# Patient Record
Sex: Female | Born: 1955 | Race: White | Hispanic: No | Marital: Married | Smoking: Never smoker
Health system: Southern US, Community
[De-identification: ages and names within clinical notes are randomized; demographics above are authoritative.]

## PROBLEM LIST (undated history)

## (undated) DIAGNOSIS — G2581 Restless legs syndrome: Secondary | ICD-10-CM

## (undated) DIAGNOSIS — M722 Plantar fascial fibromatosis: Secondary | ICD-10-CM

## (undated) DIAGNOSIS — N8110 Cystocele, unspecified: Secondary | ICD-10-CM

## (undated) DIAGNOSIS — N393 Stress incontinence (female) (male): Secondary | ICD-10-CM

## (undated) DIAGNOSIS — B029 Zoster without complications: Secondary | ICD-10-CM

## (undated) DIAGNOSIS — M1990 Unspecified osteoarthritis, unspecified site: Secondary | ICD-10-CM

## (undated) DIAGNOSIS — N3281 Overactive bladder: Secondary | ICD-10-CM

## (undated) DIAGNOSIS — Z973 Presence of spectacles and contact lenses: Secondary | ICD-10-CM

## (undated) DIAGNOSIS — E7800 Pure hypercholesterolemia, unspecified: Secondary | ICD-10-CM

## (undated) HISTORY — PX: COLONOSCOPY: SHX174

---

## 2004-08-09 ENCOUNTER — Ambulatory Visit: Payer: Self-pay | Admitting: Family Medicine

## 2005-10-17 ENCOUNTER — Ambulatory Visit: Payer: Self-pay | Admitting: Internal Medicine

## 2007-08-21 ENCOUNTER — Ambulatory Visit: Payer: Self-pay

## 2008-09-01 ENCOUNTER — Ambulatory Visit: Payer: Self-pay

## 2009-08-30 ENCOUNTER — Ambulatory Visit: Payer: Self-pay | Admitting: Unknown Physician Specialty

## 2009-10-12 ENCOUNTER — Ambulatory Visit: Payer: Self-pay

## 2010-10-21 ENCOUNTER — Ambulatory Visit: Payer: Self-pay

## 2011-11-29 ENCOUNTER — Ambulatory Visit: Payer: Self-pay

## 2013-05-27 ENCOUNTER — Ambulatory Visit: Payer: Self-pay

## 2015-08-13 ENCOUNTER — Other Ambulatory Visit: Payer: Self-pay | Admitting: Obstetrics and Gynecology

## 2015-08-13 DIAGNOSIS — Z1231 Encounter for screening mammogram for malignant neoplasm of breast: Secondary | ICD-10-CM

## 2015-08-19 ENCOUNTER — Ambulatory Visit
Admission: RE | Admit: 2015-08-19 | Discharge: 2015-08-19 | Disposition: A | Discharge status: Home / Self Care | Payer: BC Managed Care – PPO | Source: Ambulatory Visit | Attending: Obstetrics and Gynecology | Admitting: Obstetrics and Gynecology

## 2015-08-19 DIAGNOSIS — Z1231 Encounter for screening mammogram for malignant neoplasm of breast: Secondary | ICD-10-CM | POA: Diagnosis present

## 2015-08-24 ENCOUNTER — Other Ambulatory Visit: Payer: Self-pay | Admitting: Obstetrics and Gynecology

## 2015-08-24 DIAGNOSIS — N6489 Other specified disorders of breast: Secondary | ICD-10-CM

## 2015-08-27 ENCOUNTER — Ambulatory Visit
Admission: RE | Admit: 2015-08-27 | Discharge: 2015-08-27 | Disposition: A | Discharge status: Home / Self Care | Payer: BC Managed Care – PPO | Source: Ambulatory Visit | Attending: Obstetrics and Gynecology | Admitting: Obstetrics and Gynecology

## 2015-08-27 DIAGNOSIS — N6489 Other specified disorders of breast: Secondary | ICD-10-CM | POA: Diagnosis present

## 2015-09-09 ENCOUNTER — Ambulatory Visit: Payer: BC Managed Care – PPO

## 2015-09-09 ENCOUNTER — Other Ambulatory Visit: Payer: BC Managed Care – PPO

## 2016-06-27 ENCOUNTER — Other Ambulatory Visit: Payer: Self-pay | Admitting: Obstetrics and Gynecology

## 2016-06-27 DIAGNOSIS — Z1231 Encounter for screening mammogram for malignant neoplasm of breast: Secondary | ICD-10-CM

## 2016-08-23 ENCOUNTER — Ambulatory Visit
Admission: RE | Admit: 2016-08-23 | Discharge: 2016-08-23 | Disposition: A | Discharge status: Home / Self Care | Payer: BC Managed Care – PPO | Source: Ambulatory Visit | Attending: Obstetrics and Gynecology | Admitting: Obstetrics and Gynecology

## 2016-08-23 DIAGNOSIS — Z1231 Encounter for screening mammogram for malignant neoplasm of breast: Secondary | ICD-10-CM | POA: Diagnosis not present

## 2016-08-30 ENCOUNTER — Other Ambulatory Visit: Payer: Self-pay | Admitting: Obstetrics and Gynecology

## 2016-08-30 DIAGNOSIS — N6320 Unspecified lump in the left breast, unspecified quadrant: Secondary | ICD-10-CM

## 2016-09-14 ENCOUNTER — Ambulatory Visit
Admission: RE | Admit: 2016-09-14 | Discharge: 2016-09-14 | Disposition: A | Discharge status: Home / Self Care | Payer: BC Managed Care – PPO | Source: Ambulatory Visit | Attending: Obstetrics and Gynecology | Admitting: Obstetrics and Gynecology

## 2016-09-14 DIAGNOSIS — N6320 Unspecified lump in the left breast, unspecified quadrant: Secondary | ICD-10-CM | POA: Diagnosis not present

## 2016-09-19 ENCOUNTER — Other Ambulatory Visit: Payer: Self-pay | Admitting: Obstetrics and Gynecology

## 2016-09-19 DIAGNOSIS — N6320 Unspecified lump in the left breast, unspecified quadrant: Secondary | ICD-10-CM

## 2016-09-28 ENCOUNTER — Ambulatory Visit
Admission: RE | Admit: 2016-09-28 | Discharge: 2016-09-28 | Disposition: A | Discharge status: Home / Self Care | Payer: BC Managed Care – PPO | Source: Ambulatory Visit | Attending: Obstetrics and Gynecology | Admitting: Obstetrics and Gynecology

## 2016-09-28 DIAGNOSIS — N6320 Unspecified lump in the left breast, unspecified quadrant: Secondary | ICD-10-CM

## 2016-09-28 DIAGNOSIS — N63 Unspecified lump in unspecified breast: Secondary | ICD-10-CM | POA: Insufficient documentation

## 2016-09-28 HISTORY — PX: BREAST BIOPSY: SHX20

## 2016-09-29 LAB — SURGICAL PATHOLOGY

## 2017-06-11 ENCOUNTER — Ambulatory Visit (INDEPENDENT_AMBULATORY_CARE_PROVIDER_SITE_OTHER): Payer: BC Managed Care – PPO

## 2017-06-11 ENCOUNTER — Encounter: Payer: Self-pay | Admitting: Podiatry

## 2017-06-11 ENCOUNTER — Other Ambulatory Visit: Payer: Self-pay | Admitting: Podiatry

## 2017-06-11 ENCOUNTER — Ambulatory Visit (INDEPENDENT_AMBULATORY_CARE_PROVIDER_SITE_OTHER): Payer: BC Managed Care – PPO | Admitting: Podiatry

## 2017-06-11 VITALS — BP 116/66 | HR 74 | Resp 16 | Ht 65.0 in | Wt 150.0 lb

## 2017-06-11 DIAGNOSIS — M79675 Pain in left toe(s): Secondary | ICD-10-CM

## 2017-06-11 DIAGNOSIS — D3610 Benign neoplasm of peripheral nerves and autonomic nervous system, unspecified: Secondary | ICD-10-CM | POA: Diagnosis not present

## 2017-06-11 DIAGNOSIS — G5762 Lesion of plantar nerve, left lower limb: Secondary | ICD-10-CM | POA: Diagnosis not present

## 2017-06-11 NOTE — Progress Notes (Signed)
   Subjective:    Patient ID: Michele Robinson, female    DOB: 12/31/1955, 61 y.o.   MRN: [id number]  HPI Chief Complaint  Patient presents with  . Toe Pain    Left foot; 2nd toe; pt stated, "Does cardio at the "[facility]"; wears orthotics to gym; about 30 minutes into routine, 2nd toe stars to ache and hurt and pain radiates to 3rd toe"; x3 months      Review of Systems  Musculoskeletal: Positive for gait problem.  All other systems reviewed and are negative.      Objective:   Physical Exam        Assessment & Plan:

## 2017-06-11 NOTE — Progress Notes (Signed)
Subjective:    Patient ID: Michele Robinson, female   DOB: 61 y.o.   MRN: [id number]   HPI patient states she gets a lot of pain between the second and third toes when she tries to be active and especially when doing the elliptical at the gym states it's been going on for a number of years    Review of Systems  All other systems reviewed and are negative.       Objective:  Physical Exam  Constitutional: She appears well-developed and well-nourished.  Cardiovascular: Intact distal pulses.   Musculoskeletal: Normal range of motion.  Neurological: She is alert.  Skin: Skin is warm.  Nursing note and vitals reviewed.  neurovascular status found to be intact muscle strength was adequate range of motion within normal limits with patient noted to have what appears to be a palpable mass second interspace left with radiating discomfort when palpated with minimal discomfort of the metatarsal phalangeal joint. Patient's found have good digital perfusion well oriented 3     Assessment:   Probability for neuroma symptomatology of the second interspace left foot with possibility for capsulitis or other inflammatory condition      Plan:    H&P and x-rays reviewed and today I did a careful injection of the second interspace with a accommodation neuro lysis purified alcohol Marcaine solution and a small amount steroid to reduce inflammation and we will reevaluate results and see how she responds to this over the next 2 weeks with consideration for further treatment. Reappoint to recheck  X-rays indicate that there is no indications of stress fracture or advanced arthritis

## 2017-06-25 ENCOUNTER — Ambulatory Visit (INDEPENDENT_AMBULATORY_CARE_PROVIDER_SITE_OTHER): Payer: BC Managed Care – PPO | Admitting: Podiatry

## 2017-06-25 ENCOUNTER — Encounter: Payer: Self-pay | Admitting: Podiatry

## 2017-06-25 DIAGNOSIS — D3610 Benign neoplasm of peripheral nerves and autonomic nervous system, unspecified: Secondary | ICD-10-CM | POA: Diagnosis not present

## 2017-06-25 DIAGNOSIS — M779 Enthesopathy, unspecified: Secondary | ICD-10-CM

## 2017-06-26 ENCOUNTER — Ambulatory Visit (INDEPENDENT_AMBULATORY_CARE_PROVIDER_SITE_OTHER): Payer: BC Managed Care – PPO | Admitting: Orthotics

## 2017-06-26 DIAGNOSIS — M779 Enthesopathy, unspecified: Secondary | ICD-10-CM | POA: Diagnosis not present

## 2017-06-26 DIAGNOSIS — D3610 Benign neoplasm of peripheral nerves and autonomic nervous system, unspecified: Secondary | ICD-10-CM

## 2017-06-26 NOTE — Progress Notes (Signed)
Subjective:    Patient ID: Michele Robinson, female   DOB: 61 y.o.   MRN: [id number]   HPI patient states that she is improving and she does not want anymore injections    ROS      Objective:  Physical Exam neurovascular status intact negative Homans sign was noted with patient's left foot showing decreased inflammation in the second interspace with mild discomfort still around the MPJs noted     Assessment:  Neuroma symptomatology which I believe has temporarily call down but is still present       Plan:  Reviewed further treatment and this point we are getting a replace rolled orthotics and put her in a new orthotic that will be softer and also try to change the position between the second and third metatarsals where there is what appears to be neuroma symptomatology and it's been recommended that a neuroma plug be placed on the third metatarsal. Patient is scheduled with pad orthotist

## 2017-06-26 NOTE — Progress Notes (Signed)
Patient came into today to be cast for [employer]. Upon recommendation of Dr. 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 Goals are rear foot stability, long arch support, neurma offload.  Plan vendor [employer] to fab semi-rigid device w/ top cover partialy glued as to be able to move neuroma pad around for optimal patient satisfaciton.

## 2017-07-10 ENCOUNTER — Other Ambulatory Visit: Payer: BC Managed Care – PPO | Admitting: Orthotics

## 2017-07-18 ENCOUNTER — Other Ambulatory Visit: Payer: Self-pay | Admitting: Obstetrics and Gynecology

## 2017-07-18 DIAGNOSIS — Z1231 Encounter for screening mammogram for malignant neoplasm of breast: Secondary | ICD-10-CM

## 2017-07-30 ENCOUNTER — Ambulatory Visit: Payer: BC Managed Care – PPO | Admitting: Orthotics

## 2017-07-30 DIAGNOSIS — M779 Enthesopathy, unspecified: Secondary | ICD-10-CM

## 2017-07-30 DIAGNOSIS — D3610 Benign neoplasm of peripheral nerves and autonomic nervous system, unspecified: Secondary | ICD-10-CM

## 2017-07-30 NOTE — Progress Notes (Signed)
Patient came into for her f/o to be adjusted to address Morton neuroma.  Pads were added but topcover not glued down due to wanting to make sure right place was hit for best possible outcome. patietn will f/up in two weeks

## 2017-08-14 ENCOUNTER — Other Ambulatory Visit: Payer: BC Managed Care – PPO | Admitting: Orthotics

## 2017-08-20 ENCOUNTER — Ambulatory Visit: Payer: BC Managed Care – PPO | Admitting: Orthotics

## 2017-08-20 DIAGNOSIS — M779 Enthesopathy, unspecified: Secondary | ICD-10-CM

## 2017-08-20 DIAGNOSIS — D3610 Benign neoplasm of peripheral nerves and autonomic nervous system, unspecified: Secondary | ICD-10-CM

## 2017-08-20 NOTE — Progress Notes (Signed)
Glued topcover onto her f/o.Marland Kitchen

## 2017-08-28 ENCOUNTER — Ambulatory Visit
Admission: RE | Admit: 2017-08-28 | Discharge: 2017-08-28 | Disposition: A | Discharge status: Home / Self Care | Payer: BC Managed Care – PPO | Source: Ambulatory Visit | Attending: Obstetrics and Gynecology | Admitting: Obstetrics and Gynecology

## 2017-08-28 DIAGNOSIS — Z1231 Encounter for screening mammogram for malignant neoplasm of breast: Secondary | ICD-10-CM | POA: Insufficient documentation

## 2018-01-17 ENCOUNTER — Other Ambulatory Visit: Payer: Self-pay | Admitting: Internal Medicine

## 2018-01-17 ENCOUNTER — Ambulatory Visit
Admission: RE | Admit: 2018-01-17 | Discharge: 2018-01-17 | Disposition: A | Discharge status: Home / Self Care | Payer: BC Managed Care – PPO | Source: Ambulatory Visit | Attending: Internal Medicine | Admitting: Internal Medicine

## 2018-01-17 DIAGNOSIS — K352 Acute appendicitis with generalized peritonitis, without abscess: Secondary | ICD-10-CM | POA: Diagnosis present

## 2018-01-17 DIAGNOSIS — R195 Other fecal abnormalities: Secondary | ICD-10-CM | POA: Diagnosis not present

## 2018-01-17 DIAGNOSIS — R1031 Right lower quadrant pain: Secondary | ICD-10-CM | POA: Diagnosis not present

## 2018-01-17 MED ORDER — IOPAMIDOL (ISOVUE-300) INJECTION 61%
100.0000 mL | Freq: Once | INTRAVENOUS | Status: AC | PRN
  Administered 2018-01-17: 100 mL via INTRAVENOUS

## 2018-01-21 ENCOUNTER — Other Ambulatory Visit: Payer: Self-pay | Admitting: Internal Medicine

## 2018-01-21 DIAGNOSIS — K352 Acute appendicitis with generalized peritonitis, without abscess: Secondary | ICD-10-CM

## 2018-08-01 ENCOUNTER — Other Ambulatory Visit: Payer: Self-pay | Admitting: Obstetrics and Gynecology

## 2018-08-01 DIAGNOSIS — Z1231 Encounter for screening mammogram for malignant neoplasm of breast: Secondary | ICD-10-CM

## 2018-08-29 ENCOUNTER — Ambulatory Visit
Admission: RE | Admit: 2018-08-29 | Discharge: 2018-08-29 | Disposition: A | Discharge status: Home / Self Care | Payer: BC Managed Care – PPO | Source: Ambulatory Visit | Attending: Obstetrics and Gynecology | Admitting: Obstetrics and Gynecology

## 2018-08-29 DIAGNOSIS — Z1231 Encounter for screening mammogram for malignant neoplasm of breast: Secondary | ICD-10-CM

## 2018-10-14 ENCOUNTER — Encounter

## 2018-10-14 ENCOUNTER — Ambulatory Visit: Payer: Self-pay | Admitting: Family Medicine

## 2019-08-07 ENCOUNTER — Other Ambulatory Visit: Payer: Self-pay | Admitting: Obstetrics and Gynecology

## 2019-08-07 DIAGNOSIS — Z1231 Encounter for screening mammogram for malignant neoplasm of breast: Secondary | ICD-10-CM

## 2019-09-01 ENCOUNTER — Ambulatory Visit
Admission: RE | Admit: 2019-09-01 | Discharge: 2019-09-01 | Disposition: A | Discharge status: Home / Self Care | Payer: BC Managed Care – PPO | Source: Ambulatory Visit | Attending: Obstetrics and Gynecology | Admitting: Obstetrics and Gynecology

## 2019-09-01 ENCOUNTER — Other Ambulatory Visit: Payer: Self-pay

## 2019-09-01 ENCOUNTER — Encounter (INDEPENDENT_AMBULATORY_CARE_PROVIDER_SITE_OTHER): Payer: Self-pay

## 2019-09-01 DIAGNOSIS — Z1231 Encounter for screening mammogram for malignant neoplasm of breast: Secondary | ICD-10-CM | POA: Insufficient documentation

## 2019-10-31 HISTORY — PX: CYSTOURETHROSCOPY: SHX476

## 2019-10-31 HISTORY — PX: OTHER SURGICAL HISTORY: SHX169

## 2020-08-03 ENCOUNTER — Other Ambulatory Visit: Payer: Self-pay | Admitting: Internal Medicine

## 2020-08-03 DIAGNOSIS — Z1231 Encounter for screening mammogram for malignant neoplasm of breast: Secondary | ICD-10-CM

## 2020-09-07 ENCOUNTER — Ambulatory Visit: Payer: BC Managed Care – PPO

## 2020-09-08 ENCOUNTER — Other Ambulatory Visit: Payer: Self-pay

## 2020-09-08 ENCOUNTER — Ambulatory Visit
Admission: RE | Admit: 2020-09-08 | Discharge: 2020-09-08 | Disposition: A | Discharge status: Home / Self Care | Payer: BC Managed Care – PPO | Source: Ambulatory Visit | Attending: Internal Medicine | Admitting: Internal Medicine

## 2020-09-08 DIAGNOSIS — Z1231 Encounter for screening mammogram for malignant neoplasm of breast: Secondary | ICD-10-CM | POA: Insufficient documentation

## 2020-09-10 ENCOUNTER — Other Ambulatory Visit: Payer: Self-pay | Admitting: Obstetrics and Gynecology

## 2020-09-14 ENCOUNTER — Other Ambulatory Visit: Payer: Self-pay | Admitting: Obstetrics and Gynecology

## 2020-09-14 DIAGNOSIS — N63 Unspecified lump in unspecified breast: Secondary | ICD-10-CM

## 2020-09-27 ENCOUNTER — Other Ambulatory Visit: Payer: BC Managed Care – PPO

## 2020-10-04 ENCOUNTER — Ambulatory Visit
Admission: RE | Admit: 2020-10-04 | Discharge: 2020-10-04 | Disposition: A | Discharge status: Home / Self Care | Payer: BC Managed Care – PPO | Source: Ambulatory Visit | Attending: Obstetrics and Gynecology | Admitting: Obstetrics and Gynecology

## 2020-10-04 ENCOUNTER — Other Ambulatory Visit: Payer: Self-pay

## 2020-10-04 DIAGNOSIS — N63 Unspecified lump in unspecified breast: Secondary | ICD-10-CM

## 2021-09-05 ENCOUNTER — Other Ambulatory Visit: Payer: Self-pay | Admitting: Obstetrics and Gynecology

## 2021-09-05 ENCOUNTER — Other Ambulatory Visit: Payer: Self-pay | Admitting: Internal Medicine

## 2021-09-05 DIAGNOSIS — Z1231 Encounter for screening mammogram for malignant neoplasm of breast: Secondary | ICD-10-CM

## 2021-10-05 ENCOUNTER — Other Ambulatory Visit: Payer: Self-pay

## 2021-10-05 ENCOUNTER — Ambulatory Visit
Admission: RE | Admit: 2021-10-05 | Discharge: 2021-10-05 | Disposition: A | Discharge status: Home / Self Care | Payer: BC Managed Care – PPO | Source: Ambulatory Visit | Attending: Internal Medicine | Admitting: Internal Medicine

## 2021-10-05 DIAGNOSIS — Z1231 Encounter for screening mammogram for malignant neoplasm of breast: Secondary | ICD-10-CM | POA: Insufficient documentation

## 2022-05-10 IMAGING — MG DIGITAL DIAGNOSTIC BILAT W/ TOMO W/ CAD
8 series · 8 of 24 positions shown · non-contrast
Comparison: Previous exam(s).

CLINICAL DATA: 64-year-old female presenting for annual exam as
well as for a possible mass in the upper inner left breast felt by
the patient's physician.

EXAM:
DIGITAL DIAGNOSTIC BILATERAL MAMMOGRAM WITH CAD AND TOMO
ULTRASOUND LEFT BREAST

[R CC synth-2D]
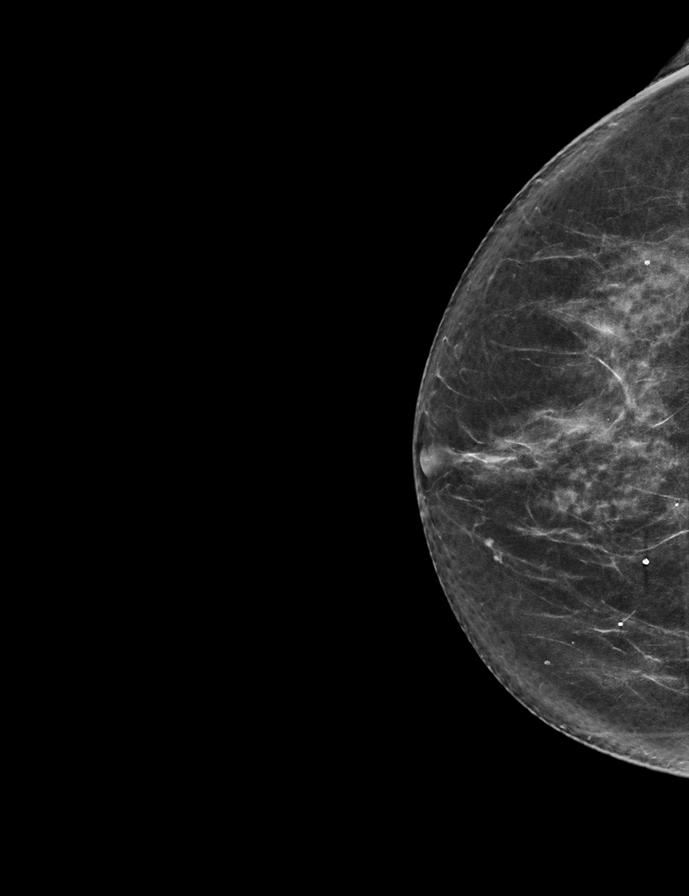

[L MLO synth-2D]
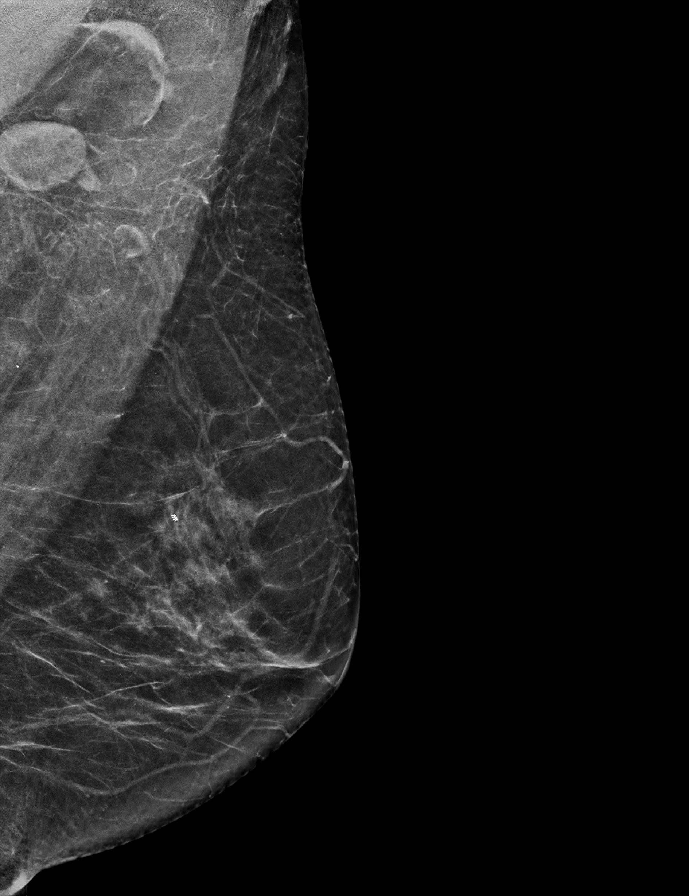

[R MLO synth-2D]
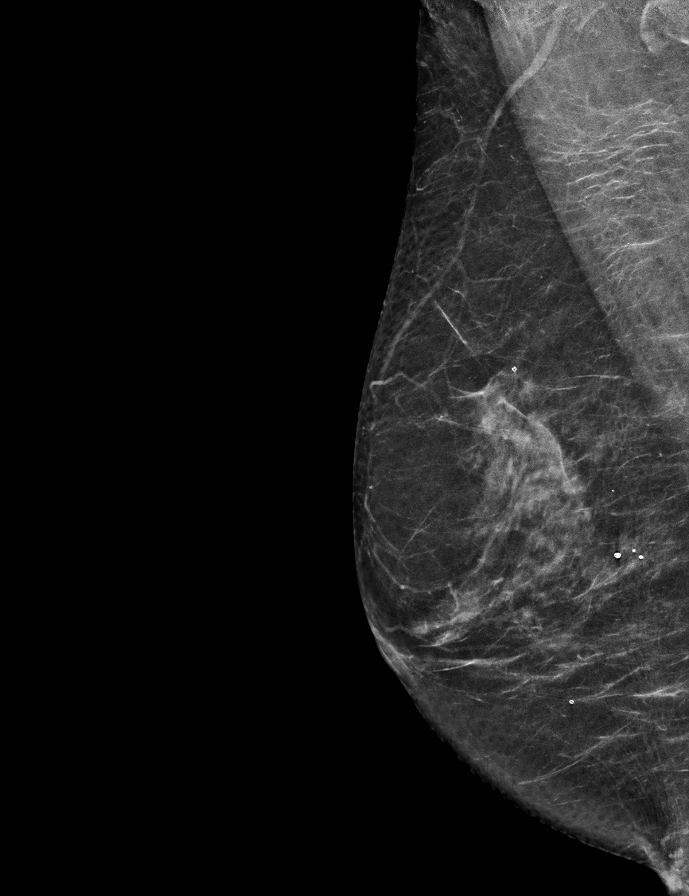

[L CC synth-2D]
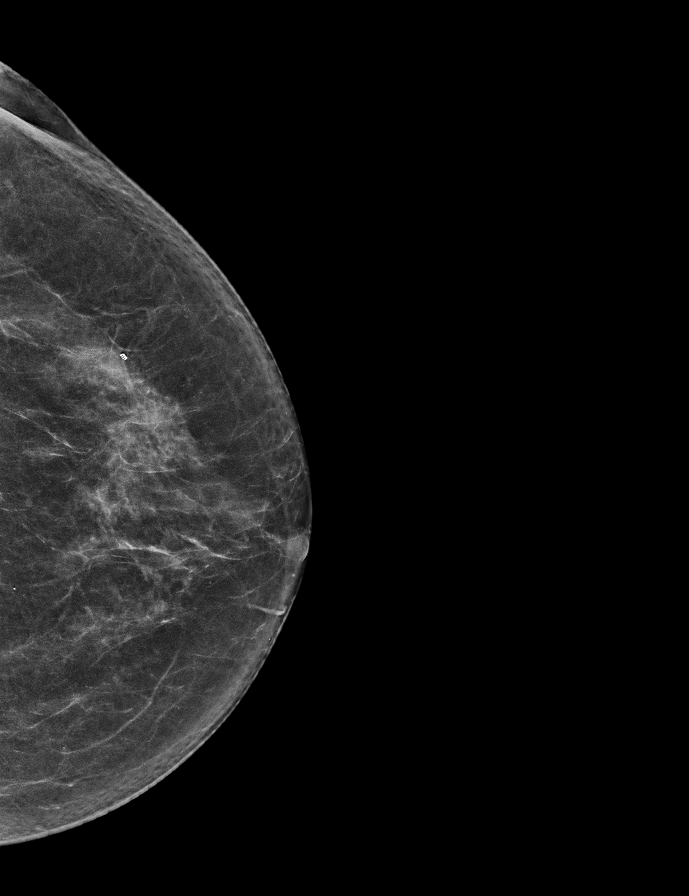

[L MLO tomo · tomo slice 33/65.0]
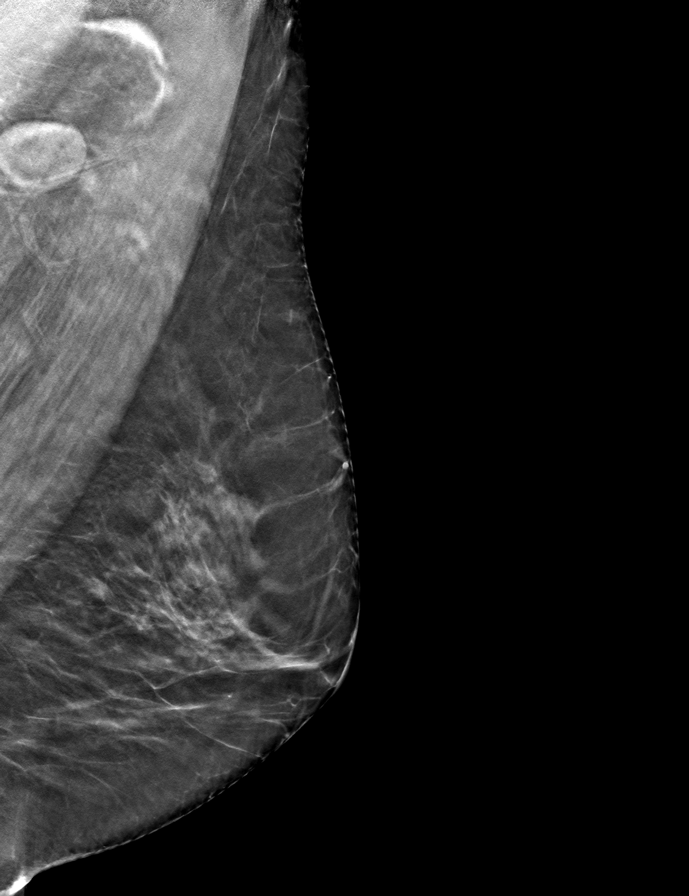

[R MLO tomo · tomo slice 31/62.0]
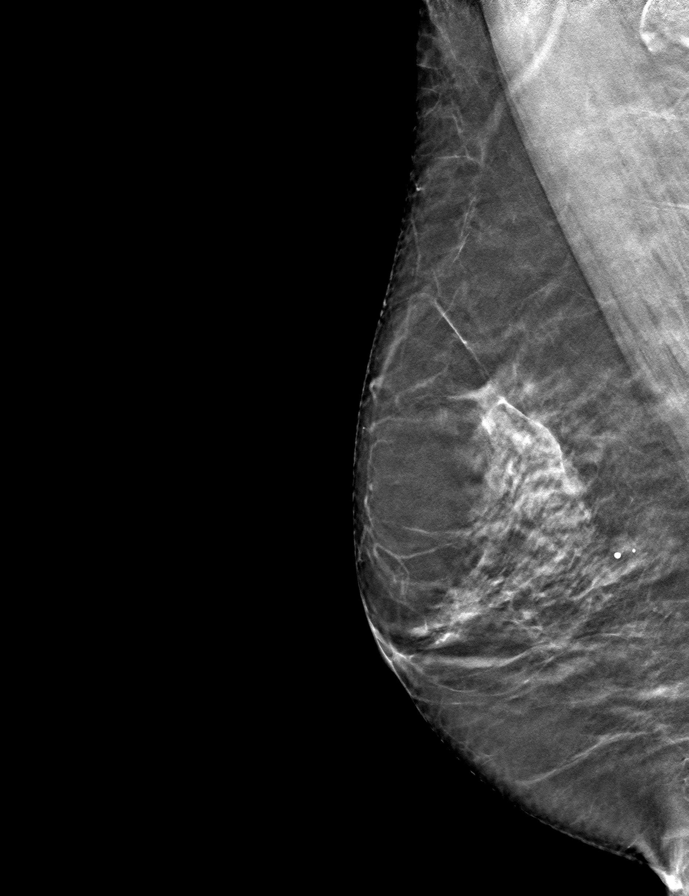

[L CC tomo · tomo slice 33/64.0]
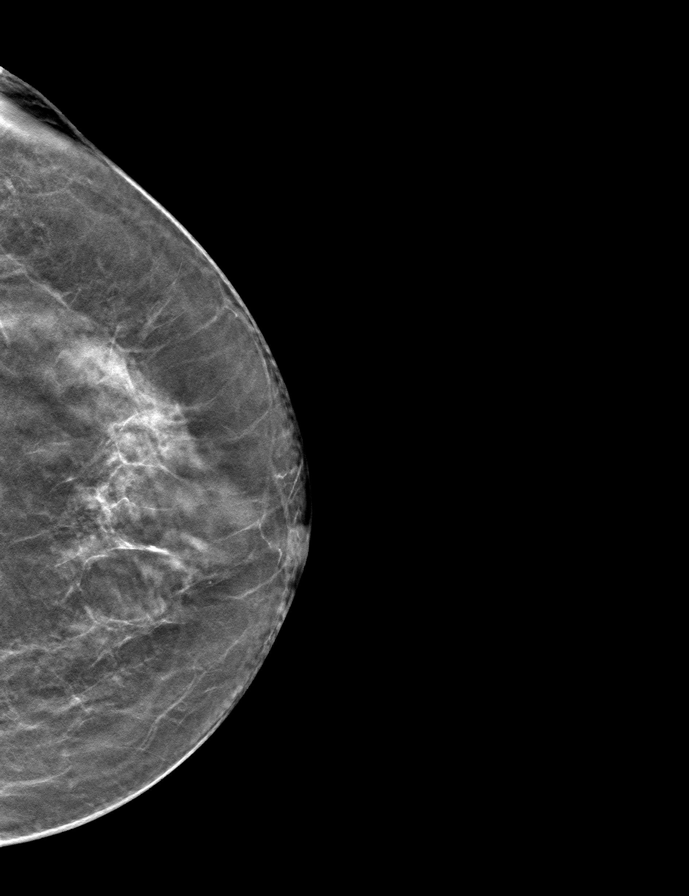

[R CC tomo · tomo slice 33/65.0]
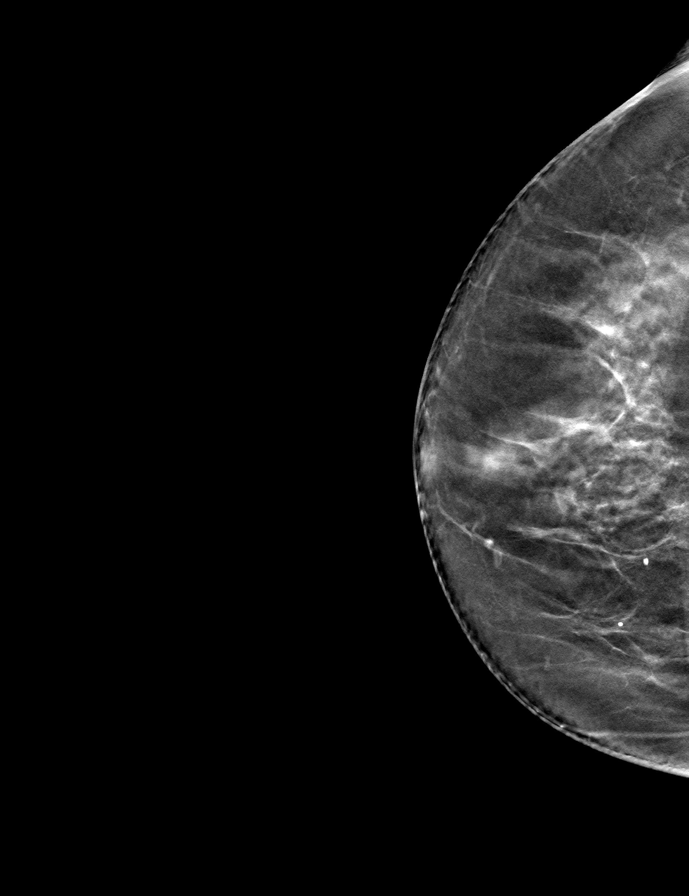

[8 of 24 positions shown; findings below may reference images not displayed]

ACR Breast Density Category c: The breast tissue is heterogeneously
dense, which may obscure small masses.
FINDINGS: Mammogram:

Right breast: No suspicious mass, distortion, or microcalcifications
are identified to suggest presence of malignancy.

Left breast: No suspicious mass, distortion, or microcalcifications
are identified to suggest presence of malignancy. Specifically there
is no new abnormality in the upper inner left breast.

There are a few mildly enlarged lymph nodes in the left axilla.

Mammographic images were processed with CAD.

Ultrasound:

Targeted ultrasound is performed in the left breast at 11 o'clock
demonstrating no cystic or solid mass.

Targeted ultrasound is performed in the left axilla demonstrating
lymph nodes with normal morphology and cortical thickness. Maximum
cortical thickness measures 0.3 cm. No suspicious lymph node
identified.
IMPRESSION: 1. No mammographic or sonographic evidence of malignancy or other
abnormality at the palpable site of concern in the upper inner
quadrant of the left breast.

2. Benign mildly enlarged left axillary lymph nodes, most likely
reactive secondary to patient's recent COVID booster vaccine.

3.  No mammographic evidence of malignancy in the right breast.

RECOMMENDATION:
1. Recommend any further workup of the palpable area of concern in
the left breast be on a clinical basis.

2.  Screening mammogram in one year.(Code:[id number]-[id number])

I have discussed the findings and recommendations with the patient.
If applicable, a reminder letter will be sent to the patient
regarding the next appointment.

BI-RADS CATEGORY  1: Negative.

## 2022-06-28 ENCOUNTER — Ambulatory Visit: Payer: BC Managed Care – PPO | Admitting: Podiatry

## 2022-06-28 ENCOUNTER — Encounter: Payer: Self-pay | Admitting: Podiatry

## 2022-06-28 ENCOUNTER — Ambulatory Visit (INDEPENDENT_AMBULATORY_CARE_PROVIDER_SITE_OTHER): Payer: Medicare HMO

## 2022-06-28 DIAGNOSIS — G5762 Lesion of plantar nerve, left lower limb: Secondary | ICD-10-CM | POA: Diagnosis not present

## 2022-06-28 DIAGNOSIS — D3610 Benign neoplasm of peripheral nerves and autonomic nervous system, unspecified: Secondary | ICD-10-CM

## 2022-06-28 DIAGNOSIS — M21619 Bunion of unspecified foot: Secondary | ICD-10-CM | POA: Diagnosis not present

## 2022-06-28 DIAGNOSIS — M21611 Bunion of right foot: Secondary | ICD-10-CM | POA: Diagnosis not present

## 2022-06-28 DIAGNOSIS — M2042 Other hammer toe(s) (acquired), left foot: Secondary | ICD-10-CM | POA: Diagnosis not present

## 2022-06-28 DIAGNOSIS — M2041 Other hammer toe(s) (acquired), right foot: Secondary | ICD-10-CM | POA: Diagnosis not present

## 2022-06-28 DIAGNOSIS — M21612 Bunion of left foot: Secondary | ICD-10-CM

## 2022-06-28 NOTE — Progress Notes (Signed)
Subjective:   Patient ID: Michele Robinson, female   DOB: 66 y.o.   MRN: [id number]   HPI Patient presents with a lot of pain in the left forefoot over the last few months with long-term history of neuroma symptoms and is concerned about the increase in size of the bunion deformity left which is becoming increasingly tender.  Patient has not been seen in a number years does not smoke likes to be active.  States that she is tried wider shoes she has tried other modalities for the bunion without relief of symptoms   Review of Systems  All other systems reviewed and are negative.       Objective:  Physical Exam Vitals and nursing note reviewed.  Constitutional:      Appearance: She is well-developed.  Pulmonary:     Effort: Pulmonary effort is normal.  Musculoskeletal:        General: Normal range of motion.  Skin:    General: Skin is warm.  Neurological:     Mental Status: She is alert.     Neurovascular status intact muscle strength adequate range of motion within normal limits with prominent first metatarsal head inflammation redness left over right and also pain with a shooting discomfort positive nodular formation third interspace left foot consistent with Biagio Borg.  Good digital perfusion well-oriented     Assessment:  Significant structural HAV deformity left with significant elevation intermetatarsal angle along with probability for neuroma symptomatology third interspace left     Plan:  H&P reviewed condition and x-rays of both feet and at this point did sterile prep and injected the third interspace with a combination of Xylocaine Marcaine steroid and dehydrated alcohol.  Did discuss surgical intervention for bunion and neuroma and patient wants to get this done and we will discuss in 2 weeks and I first want to see the response to conservative treatment for the left foot.  Discussed distal osteotomy what would be required and recovery  X-rays indicate significant  elevation of the intermetatarsal angle left over right with no indications of arthritis or other pathology

## 2022-07-14 ENCOUNTER — Ambulatory Visit (INDEPENDENT_AMBULATORY_CARE_PROVIDER_SITE_OTHER): Payer: Medicare HMO | Admitting: Podiatry

## 2022-07-14 ENCOUNTER — Encounter: Payer: Self-pay | Admitting: Podiatry

## 2022-07-14 DIAGNOSIS — D3610 Benign neoplasm of peripheral nerves and autonomic nervous system, unspecified: Secondary | ICD-10-CM | POA: Diagnosis not present

## 2022-07-14 DIAGNOSIS — M21619 Bunion of unspecified foot: Secondary | ICD-10-CM

## 2022-07-14 DIAGNOSIS — M21612 Bunion of left foot: Secondary | ICD-10-CM | POA: Diagnosis not present

## 2022-07-16 NOTE — Progress Notes (Signed)
Subjective:   Patient ID: Michele Robinson, female   DOB: 66 y.o.   MRN: [id number]   HPI Patient presents stating that still having pain off and on with the left foot and would like to have this foot fixed stating that she would like to have bunion correction and neuroma removed.  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  Explained procedure risk and patient wants surgery understanding risk and at this point I allowed her to read consent form going over pretreatment alternative treatments and risk.  She is willing to accept risk understands total recovery period can take approximate 6 months and at this point signed consent form scheduled for outpatient surgery.  Dispensed air fracture walker which was fitted properly to her lower leg with all instructions on usage and I would like her to get used to it prior to the procedure so she is comfortable with it at the time of surgery

## 2022-07-25 ENCOUNTER — Telehealth: Payer: Self-pay

## 2022-07-25 NOTE — Telephone Encounter (Signed)
Michele Robinson called to cancel her surgery with Dr. Paulla Dolly on 08/29/2022. She stated she just can't do it right now and will follow up with [place] after the first of the year. Notified Dr. Paulla Dolly and Caren Griffins with [facility]

## 2022-08-15 ENCOUNTER — Other Ambulatory Visit: Payer: Self-pay | Admitting: Podiatry

## 2022-08-21 ENCOUNTER — Other Ambulatory Visit: Payer: Self-pay | Admitting: Internal Medicine

## 2022-08-21 ENCOUNTER — Other Ambulatory Visit: Payer: Self-pay | Admitting: Obstetrics and Gynecology

## 2022-08-21 DIAGNOSIS — Z1231 Encounter for screening mammogram for malignant neoplasm of breast: Secondary | ICD-10-CM

## 2022-08-24 ENCOUNTER — Encounter: Payer: Self-pay | Admitting: Podiatry

## 2022-08-25 NOTE — Discharge Instructions (Signed)
[facility]  POST OPERATIVE INSTRUCTIONS FOR DR. FOWLER AND [facility]   Take your medication as prescribed.  Pain medication should be taken only as needed.  Keep the dressing clean, dry and intact.  Keep your foot elevated above the heart level for the first 48 hours.  Walking to the bathroom and brief periods of walking are acceptable, unless we have instructed you to be non-weight bearing.  Always wear your post-op shoe when walking.  Always use your crutches if you are to be non-weight bearing.  Do not take a shower. Baths are permissible as long as the foot is kept out of the water.   Every hour you are awake:  Bend your knee 15 times. Flex foot 15 times Massage calf 15 times  Call [facility] ([phone]) if any of the following problems occur: You develop a temperature or fever. The bandage becomes saturated with blood. Medication does not stop your pain. Injury of the foot occurs. Any symptoms of infection including redness, odor, or red streaks running from wound. 

## 2022-08-30 ENCOUNTER — Encounter
Admission: RE | Disposition: A | Discharge status: Home / Self Care | Payer: Self-pay | Source: Ambulatory Visit | Attending: Podiatry

## 2022-08-30 ENCOUNTER — Ambulatory Visit: Payer: Self-pay

## 2022-08-30 ENCOUNTER — Ambulatory Visit: Payer: Medicare HMO | Admitting: Anesthesiology

## 2022-08-30 ENCOUNTER — Other Ambulatory Visit: Payer: Self-pay

## 2022-08-30 ENCOUNTER — Ambulatory Visit
Admission: RE | Admit: 2022-08-30 | Discharge: 2022-08-30 | Disposition: A | Discharge status: Home / Self Care | Payer: Medicare HMO | Source: Ambulatory Visit | Attending: Podiatry | Admitting: Podiatry

## 2022-08-30 ENCOUNTER — Encounter: Payer: Self-pay | Admitting: Podiatry

## 2022-08-30 DIAGNOSIS — M2012 Hallux valgus (acquired), left foot: Secondary | ICD-10-CM | POA: Insufficient documentation

## 2022-08-30 DIAGNOSIS — G5762 Lesion of plantar nerve, left lower limb: Secondary | ICD-10-CM | POA: Diagnosis not present

## 2022-08-30 HISTORY — DX: Overactive bladder: N32.81

## 2022-08-30 HISTORY — DX: Plantar fascial fibromatosis: M72.2

## 2022-08-30 HISTORY — PX: BUNIONECTOMY: SHX129

## 2022-08-30 HISTORY — DX: Restless legs syndrome: G25.81

## 2022-08-30 HISTORY — DX: Unspecified osteoarthritis, unspecified site: M19.90

## 2022-08-30 HISTORY — DX: Pure hypercholesterolemia, unspecified: E78.00

## 2022-08-30 HISTORY — DX: Zoster without complications: B02.9

## 2022-08-30 HISTORY — DX: Presence of spectacles and contact lenses: Z97.3

## 2022-08-30 HISTORY — PX: EXCISION MORTON'S NEUROMA: SHX5013

## 2022-08-30 SURGERY — EXCISION, MORTON'S NEUROMA
Anesthesia: General | Site: Toe | Laterality: Left

## 2022-08-30 MED ORDER — MIDAZOLAM HCL 5 MG/5ML IJ SOLN
INTRAMUSCULAR | Status: DC | PRN
  Administered 2022-08-30: 2 mg via INTRAVENOUS

## 2022-08-30 MED ORDER — OXYCODONE-ACETAMINOPHEN 5-325 MG PO TABS: 1.0000 | ORAL_TABLET | Freq: Four times a day (QID) | ORAL | 0 refills | Status: DC | PRN

## 2022-08-30 MED ORDER — LACTATED RINGERS IV SOLN: INTRAVENOUS | Status: DC

## 2022-08-30 MED ORDER — PROPOFOL 10 MG/ML IV BOLUS
INTRAVENOUS | Status: DC | PRN
  Administered 2022-08-30: 150 mg via INTRAVENOUS
  Administered 2022-08-30: 25 mg via INTRAVENOUS
  Administered 2022-08-30: 200 ug/kg/min via INTRAVENOUS
  Administered 2022-08-30: 25 mg via INTRAVENOUS

## 2022-08-30 MED ORDER — DEXAMETHASONE SODIUM PHOSPHATE 4 MG/ML IJ SOLN
INTRAMUSCULAR | Status: DC | PRN
  Administered 2022-08-30: 8 mg via INTRAVENOUS

## 2022-08-30 MED ORDER — ONDANSETRON HCL 4 MG/2ML IJ SOLN
INTRAMUSCULAR | Status: DC | PRN
  Administered 2022-08-30: 4 mg via INTRAVENOUS

## 2022-08-30 MED ORDER — CEFAZOLIN SODIUM-DEXTROSE 2-4 GM/100ML-% IV SOLN: 2.0000 g | INTRAVENOUS | Status: DC

## 2022-08-30 MED ORDER — FENTANYL CITRATE (PF) 100 MCG/2ML IJ SOLN
INTRAMUSCULAR | Status: DC | PRN
  Administered 2022-08-30: 25 ug via INTRAVENOUS
  Administered 2022-08-30: 75 ug via INTRAVENOUS

## 2022-08-30 MED ORDER — SCOPOLAMINE 1 MG/3DAYS TD PT72
1.0000 | MEDICATED_PATCH | TRANSDERMAL | Status: DC
  Administered 2022-08-30: 1.5 mg via TRANSDERMAL

## 2022-08-30 MED ORDER — BUPIVACAINE LIPOSOME 1.3 % IJ SUSP
INTRAMUSCULAR | Status: DC | PRN
  Administered 2022-08-30: 10 mL
  Administered 2022-08-30: 5 mL

## 2022-08-30 MED ORDER — SODIUM CHLORIDE 0.9 % IR SOLN
Status: DC | PRN
  Administered 2022-08-30: 1

## 2022-08-30 MED ORDER — BUPIVACAINE HCL (PF) 0.25 % IJ SOLN
INTRAMUSCULAR | Status: DC | PRN
  Administered 2022-08-30: 5 mL
  Administered 2022-08-30: 10 mL
  Administered 2022-08-30: 5 mL

## 2022-08-30 SURGICAL SUPPLY — 49 items
APL SKNCLS STERI-STRIP NONHPOA (GAUZE/BANDAGES/DRESSINGS) ×1
BENZOIN TINCTURE PRP APPL 2/3 (GAUZE/BANDAGES/DRESSINGS) ×1 IMPLANT
BLADE SAW LAPIPLASTY 40X11 (BLADE) IMPLANT
BLADE SURG 15 STRL LF DISP TIS (BLADE) IMPLANT
BLADE SURG 15 STRL SS (BLADE) ×2
BNDG CMPR 5X4 CHSV STRCH STRL (GAUZE/BANDAGES/DRESSINGS) ×1
BNDG CMPR 75X41 PLY HI ABS (GAUZE/BANDAGES/DRESSINGS) ×1
BNDG COHESIVE 4X5 TAN STRL LF (GAUZE/BANDAGES/DRESSINGS) IMPLANT
BNDG ELASTIC 4X5.8 VLCR STR LF (GAUZE/BANDAGES/DRESSINGS) ×1 IMPLANT
BNDG ESMARK 4X12 TAN STRL LF (GAUZE/BANDAGES/DRESSINGS) ×1 IMPLANT
BNDG STRETCH 4X75 STRL LF (GAUZE/BANDAGES/DRESSINGS) ×1 IMPLANT
CANISTER SUCT 1200ML W/VALVE (MISCELLANEOUS) ×1 IMPLANT
COUNTER NEEDLE 20/40 LG (NEEDLE) IMPLANT
COVER LIGHT HANDLE UNIVERSAL (MISCELLANEOUS) ×2 IMPLANT
DRAPE FLUOR MINI C-ARM 54X84 (DRAPES) ×1 IMPLANT
DURAPREP 26ML APPLICATOR (WOUND CARE) ×1 IMPLANT
ELECT REM PT RETURN 9FT ADLT (ELECTROSURGICAL) ×1
ELECTRODE REM PT RTRN 9FT ADLT (ELECTROSURGICAL) ×1 IMPLANT
GAUZE SPONGE 4X4 12PLY STRL (GAUZE/BANDAGES/DRESSINGS) ×1 IMPLANT
GAUZE XEROFORM 1X8 LF (GAUZE/BANDAGES/DRESSINGS) ×1 IMPLANT
GLOVE INDICATOR 8.0 STRL GRN (GLOVE) ×1 IMPLANT
GLOVE SRG 8 PF TXTR STRL LF DI (GLOVE) ×2 IMPLANT
GLOVE SURG ENC MOIS LTX SZ7.5 (GLOVE) ×2 IMPLANT
GLOVE SURG UNDER POLY LF SZ8 (GLOVE) ×2
GOWN STRL REUS W/ TWL LRG LVL3 (GOWN DISPOSABLE) ×2 IMPLANT
GOWN STRL REUS W/TWL LRG LVL3 (GOWN DISPOSABLE) ×2
KIT TURNOVER KIT A (KITS) ×1 IMPLANT
LAPIPLASTY SYS 4A (Orthopedic Implant) ×1 IMPLANT
NDL HYPO 25GX1 SAFETY (NEEDLE) IMPLANT
NDL HYPO 25GX1X1/2 BEV (NEEDLE) ×2 IMPLANT
NEEDLE HYPO 25GX1 SAFETY (NEEDLE) ×1 IMPLANT
NEEDLE HYPO 25GX1X1/2 BEV (NEEDLE) ×2 IMPLANT
NS IRRIG 500ML POUR BTL (IV SOLUTION) ×1 IMPLANT
PACK EXTREMITY ARMC (MISCELLANEOUS) ×1 IMPLANT
RASP SM TEAR CROSS CUT (RASP) IMPLANT
SCREW 2.7 HIGH PITCH LOCKING (Screw) IMPLANT
SCREW HIGH PITCH LOCK 2.7 (Screw) IMPLANT
STOCKINETTE IMPERVIOUS LG (DRAPES) ×1 IMPLANT
STRAP BODY AND KNEE 60X3 (MISCELLANEOUS) ×1 IMPLANT
STRIP CLOSURE SKIN 1/4X4 (GAUZE/BANDAGES/DRESSINGS) ×1 IMPLANT
SUT ETHILON 3-0 (SUTURE) IMPLANT
SUT MNCRL 4-0 (SUTURE) ×1
SUT MNCRL 4-0 27XMFL (SUTURE) ×1
SUT VIC AB 3-0 SH 27 (SUTURE) ×1
SUT VIC AB 3-0 SH 27X BRD (SUTURE) IMPLANT
SUT VIC AB 4-0 PS2 18 (SUTURE) IMPLANT
SUTURE MNCRL 4-0 27XMF (SUTURE) IMPLANT
SYR 10ML LL (SYRINGE) ×1 IMPLANT
SYSTEM LAPIPLASTY 4A (Orthopedic Implant) IMPLANT

## 2022-08-30 NOTE — H&P (Signed)
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  The various methods of treatment have been discussed with the patient.  No guarantees were given.  After consideration of risks, benefits and other options for treatment, the patient has consented to surgery.  I have reviewed the patients' chart and labs.     Robinson history and physical examination was performed in my office.  The patient was reexamined.  There have been no changes to this history and physical examination.  Michele Robinson

## 2022-08-30 NOTE — Transfer of Care (Signed)
Immediate Anesthesia Transfer of Care Note  Patient: Corlis Angelica Ambroise  Procedure(s) Performed: EXCISION MORTON'S NEUROMA (Left: Toe) LAPIDUS - TYPE (Left: Toe)  Patient Location: PACU  Anesthesia Type: General  Level of Consciousness: awake, alert  and patient cooperative  Airway and Oxygen Therapy: Patient Spontanous Breathing and Patient connected to supplemental oxygen  Post-op Assessment: Post-op Vital signs reviewed, Patient's Cardiovascular Status Stable, Respiratory Function Stable, Patent Airway and No signs of Nausea or vomiting  Post-op Vital Signs: Reviewed and stable  Complications: No notable events documented.

## 2022-08-30 NOTE — Op Note (Signed)
Operative note   Surgeon:Andrzej Scully Lawyer: None    Preop diagnosis: 1.Hallux valgus deformity left foot  2.  Morton's neuroma third webspace left foot    Postop diagnosis: Same    Procedure: 1.Lapidus hallux valgus correction left foot  2. Morton's neuroma third webspace left foot 3.  Intraoperative fluoroscopy use    EBL: Minimal    Anesthesia:local and general local consisted of a total of approximately 15 cc of 0.25% bupivacaine and 15 cc of Exparel long-acting anesthetic    Hemostasis: Mid calf tourniquet inflated to 200 mmHg for approximately 80 minutes    Specimen: Neuroma left third webspace    Complications: None    Operative indications:Michele Robinson is an 66 y.o. that presents today for surgical intervention.  The risks/benefits/alternatives/complications have been discussed and consent has been given.    Procedure:  Patient was brought into the OR and placed on the operating table in thesupine position. After anesthesia was obtained theleft lower extremity was prepped and draped in usual sterile fashion.  Attention was directed to the dorsal aspect of the foot where a dorsal incision was made at the first met cuneiforms joint.  Sharp and blunt dissection was carried down to the periosteum.  Subperiosteal dissection was then undertaken.  This exposed the first met cuneiform joint.  This was then freed and loosened.  A small fulcrum was placed between the base of the first metatarsal and second metatarsal.  Next the joint positioner was placed on the medial aspect of the metatarsal.  A small stab incision was made at the second metatarsal.  Compression was placed for the positioner.  Good realignment of the first intermetatarsal angle was noted at this time.  Attention was then directed to the dorsomedial first MTPJ where an incision was performed.  Sharp and blunt dissection was carried down to the capsule.  The intermetatarsal space was then entered.  The  conjoined tendon of the abductor was then freed from the base of the proximal phalanx.  Attention was to redirected to the first met cuneiform joint.  At this time the osteotomy cut guide was placed into the joint region.  2 vertical cuts were then made.  The cartilage material was removed from the first met cuneiform joint and the joint was then prepped with a 2.0 mm drill bit.  The joint compressor was then placed.  Good compression and realignment was noted.  Next the medial and dorsal locking plates were then placed from the [place] set.  Realignment and stability was noted in all planes.  Attention was redirected to the dorsomedial first MTPJ.  A small T capsulotomy was performed.  The dorsomedial eminence was noted and transected and smoothed with a power rasp.  A small capsulorrhaphy was performed.  Closure was then performed after all areas were irrigated.  3-0 Vicryl for the capsular tissue.  4-0 Vicryl in subcutaneous tissue and a 4-0 Monocryl for the skin.  Intraoperative fluoroscopy was used throughout the procedure to evaluate the pre and post osteotomy cuts and positioning of the hardware.  Attention was then directed to the third webspace where a dorsal web splitting incision was performed.  Sharp and blunt dissection was carried down to the DTI L.  This was then transected.  Next the neuroma was noted and the distal arms were dissected away and transected.  The dissection was carried back to the level of the metatarsal head and the neuroma was then excised at this time.  This was sent for pathological examination.  The wound was flushed with copious amounts of irrigation.  Closure was then performed with a 4-0 Vicryl the subcutaneous tissue and a 4-0 Monocryl for the skin.  Further local anesthesia was performed at the end of the case.    Patient tolerated the procedure and anesthesia well.  Was transported from the OR to the PACU with all vital signs stable and vascular status intact.  To be discharged per routine protocol.  Will follow up in approximately 1 week in the outpatient clinic.

## 2022-08-30 NOTE — Anesthesia Procedure Notes (Signed)
Procedure Name: LMA Insertion Date/Time: 08/30/2022 2:38 PM  Performed by: Tobie Poet, CRNAPre-anesthesia Checklist: Patient identified, Emergency Drugs available, Suction available and Patient being monitored Patient Re-evaluated:Patient Re-evaluated prior to induction Oxygen Delivery Method: Circle system utilized Preoxygenation: Pre-oxygenation with 100% oxygen Induction Type: IV induction Ventilation: Mask ventilation without difficulty LMA: LMA inserted LMA Size: 4.0 Tube type: Oral Number of attempts: 1 Airway Equipment and Method: Oral airway Placement Confirmation: positive ETCO2 and breath sounds checked- equal and bilateral Tube secured with: Tape Dental Injury: Teeth and Oropharynx as per pre-operative assessment

## 2022-08-31 ENCOUNTER — Encounter: Payer: Self-pay | Admitting: Podiatry

## 2022-09-01 LAB — SURGICAL PATHOLOGY

## 2022-09-03 NOTE — Anesthesia Postprocedure Evaluation (Signed)
Anesthesia Post Note  Patient: Kasia Trego  Procedure(s) Performed: EXCISION MORTON'S NEUROMA (Left: Toe) LAPIDUS - TYPE (Left: Toe)  Patient location during evaluation: PACU Anesthesia Type: General Level of consciousness: awake and alert Pain management: pain level controlled Vital Signs Assessment: post-procedure vital signs reviewed and stable Respiratory status: spontaneous breathing, nonlabored ventilation, respiratory function stable and patient connected to nasal cannula oxygen Cardiovascular status: blood pressure returned to baseline and stable Postop Assessment: no apparent nausea or vomiting Anesthetic complications: no   No notable events documented.   Last Vitals:  Vitals:   08/30/22 1630 08/30/22 1640  BP: 115/66 (!) 119/53  Pulse: 60 (!) 58  Resp: 10 11  Temp:  (!) 36.3 C  SpO2: 95% 97%    Last Pain:  Vitals:   08/31/22 1353  TempSrc:   PainSc: 0-No pain                 Molli Barrows

## 2022-09-03 NOTE — Anesthesia Preprocedure Evaluation (Signed)
Anesthesia Evaluation  Patient identified by MRN, date of birth, ID band Patient awake    Reviewed: Allergy & Precautions, H&P , NPO status , Patient's Chart, lab work & pertinent test results, reviewed documented beta blocker date and time   Airway Mallampati: II   Neck ROM: full    Dental  (+) Poor Dentition   Pulmonary neg pulmonary ROS   Pulmonary exam normal        Cardiovascular negative cardio ROS Normal cardiovascular exam Rhythm:regular Rate:Normal     Neuro/Psych negative neurological ROS  negative psych ROS   GI/Hepatic negative GI ROS, Neg liver ROS,,,  Endo/Other  negative endocrine ROS    Renal/GU negative Renal ROS  negative genitourinary   Musculoskeletal   Abdominal   Peds  Hematology negative hematology ROS (+)   Anesthesia Other Findings Past Medical History: No date: Arthritis     Comment:  hips and thunbs No date: Hypercholesteremia No date: OAB (overactive bladder) No date: Plantar fasciitis, bilateral No date: RLS (restless legs syndrome) No date: Shingles     Comment:  right eye No date: Wears contact lenses Past Surgical History: 09/28/2016: BREAST BIOPSY; Left     Comment:  benign 08/30/2022: BUNIONECTOMY; Left     Comment:  Procedure: LAPIDUS - TYPE;  Surgeon: Samara Deist,               DPM;  Location: [place];  Service: Podiatry;               Laterality: Left; No date: CESAREAN SECTION No date: COLONOSCOPY 2021: CYSTOURETHROSCOPY     Comment:  with colporrhaphy (anterior cystocele repair)  and               bladder sling 08/30/2022: EXCISION MORTON'S NEUROMA; Left     Comment:  Procedure: EXCISION MORTON'S NEUROMA;  Surgeon: Samara Deist, DPM;  Location: [place];  Service:               Podiatry;  Laterality: Left; BMI    Body Mass Index: 29.87 kg/m     Reproductive/Obstetrics negative OB ROS                              Anesthesia Physical Anesthesia Plan  ASA: 3  Anesthesia Plan: General   Post-op Pain Management:    Induction:   PONV Risk Score and Plan:   Airway Management Planned:   Additional Equipment:   Intra-op Plan:   Post-operative Plan:   Informed Consent: I have reviewed the patients History and Physical, chart, labs and discussed the procedure including the risks, benefits and alternatives for the proposed anesthesia with the patient or authorized representative who has indicated his/her understanding and acceptance.     Dental Advisory Given  Plan Discussed with: CRNA  Anesthesia Plan Comments:        Anesthesia Quick Evaluation

## 2022-09-04 ENCOUNTER — Encounter: Payer: Medicare HMO | Admitting: Podiatry

## 2022-09-25 ENCOUNTER — Encounter: Payer: Medicare HMO | Admitting: Podiatry

## 2022-10-06 ENCOUNTER — Ambulatory Visit
Admission: RE | Admit: 2022-10-06 | Discharge: 2022-10-06 | Disposition: A | Discharge status: Home / Self Care | Payer: Medicare HMO | Source: Ambulatory Visit | Attending: Obstetrics and Gynecology | Admitting: Obstetrics and Gynecology

## 2022-10-06 DIAGNOSIS — Z1231 Encounter for screening mammogram for malignant neoplasm of breast: Secondary | ICD-10-CM | POA: Insufficient documentation

## 2023-05-15 ENCOUNTER — Other Ambulatory Visit: Payer: Self-pay | Admitting: Internal Medicine

## 2023-05-15 DIAGNOSIS — E782 Mixed hyperlipidemia: Secondary | ICD-10-CM

## 2023-05-15 DIAGNOSIS — Z0000 Encounter for general adult medical examination without abnormal findings: Secondary | ICD-10-CM

## 2023-05-21 ENCOUNTER — Ambulatory Visit
Admission: RE | Admit: 2023-05-21 | Discharge: 2023-05-21 | Disposition: A | Discharge status: Home / Self Care | Payer: Self-pay | Source: Ambulatory Visit | Attending: Internal Medicine | Admitting: Internal Medicine

## 2023-05-21 ENCOUNTER — Other Ambulatory Visit: Payer: BC Managed Care – PPO

## 2023-05-21 DIAGNOSIS — Z0000 Encounter for general adult medical examination without abnormal findings: Secondary | ICD-10-CM | POA: Insufficient documentation

## 2023-05-21 DIAGNOSIS — E782 Mixed hyperlipidemia: Secondary | ICD-10-CM | POA: Insufficient documentation

## 2023-08-24 ENCOUNTER — Other Ambulatory Visit: Payer: Self-pay | Admitting: Internal Medicine

## 2023-08-24 DIAGNOSIS — Z1231 Encounter for screening mammogram for malignant neoplasm of breast: Secondary | ICD-10-CM

## 2023-10-09 ENCOUNTER — Ambulatory Visit
Admission: RE | Admit: 2023-10-09 | Discharge: 2023-10-09 | Disposition: A | Discharge status: Home / Self Care | Payer: Medicare HMO | Source: Ambulatory Visit | Attending: Internal Medicine | Admitting: Internal Medicine

## 2023-10-09 DIAGNOSIS — Z1231 Encounter for screening mammogram for malignant neoplasm of breast: Secondary | ICD-10-CM | POA: Insufficient documentation

## 2023-11-12 DIAGNOSIS — L570 Actinic keratosis: Secondary | ICD-10-CM | POA: Diagnosis not present

## 2024-02-05 DIAGNOSIS — H25043 Posterior subcapsular polar age-related cataract, bilateral: Secondary | ICD-10-CM | POA: Diagnosis not present

## 2024-02-05 DIAGNOSIS — H2511 Age-related nuclear cataract, right eye: Secondary | ICD-10-CM | POA: Diagnosis not present

## 2024-02-05 DIAGNOSIS — H25013 Cortical age-related cataract, bilateral: Secondary | ICD-10-CM | POA: Diagnosis not present

## 2024-02-05 DIAGNOSIS — H18413 Arcus senilis, bilateral: Secondary | ICD-10-CM | POA: Diagnosis not present

## 2024-02-05 DIAGNOSIS — H2513 Age-related nuclear cataract, bilateral: Secondary | ICD-10-CM | POA: Diagnosis not present

## 2024-03-03 DIAGNOSIS — L309 Dermatitis, unspecified: Secondary | ICD-10-CM | POA: Diagnosis not present

## 2024-03-03 DIAGNOSIS — L578 Other skin changes due to chronic exposure to nonionizing radiation: Secondary | ICD-10-CM | POA: Diagnosis not present

## 2024-03-03 DIAGNOSIS — L821 Other seborrheic keratosis: Secondary | ICD-10-CM | POA: Diagnosis not present

## 2024-03-03 DIAGNOSIS — L814 Other melanin hyperpigmentation: Secondary | ICD-10-CM | POA: Diagnosis not present

## 2024-03-03 DIAGNOSIS — D225 Melanocytic nevi of trunk: Secondary | ICD-10-CM | POA: Diagnosis not present

## 2024-03-20 DIAGNOSIS — M180 Bilateral primary osteoarthritis of first carpometacarpal joints: Secondary | ICD-10-CM | POA: Diagnosis not present

## 2024-03-20 DIAGNOSIS — E6609 Other obesity due to excess calories: Secondary | ICD-10-CM | POA: Diagnosis not present

## 2024-03-20 DIAGNOSIS — E782 Mixed hyperlipidemia: Secondary | ICD-10-CM | POA: Diagnosis not present

## 2024-03-20 DIAGNOSIS — G2581 Restless legs syndrome: Secondary | ICD-10-CM | POA: Diagnosis not present

## 2024-03-20 DIAGNOSIS — E66811 Obesity, class 1: Secondary | ICD-10-CM | POA: Diagnosis not present

## 2024-03-20 DIAGNOSIS — N3946 Mixed incontinence: Secondary | ICD-10-CM | POA: Diagnosis not present

## 2024-03-20 DIAGNOSIS — Z6832 Body mass index (BMI) 32.0-32.9, adult: Secondary | ICD-10-CM | POA: Diagnosis not present

## 2024-04-21 DIAGNOSIS — H2511 Age-related nuclear cataract, right eye: Secondary | ICD-10-CM | POA: Diagnosis not present

## 2024-04-22 DIAGNOSIS — H2512 Age-related nuclear cataract, left eye: Secondary | ICD-10-CM | POA: Diagnosis not present

## 2024-04-22 DIAGNOSIS — H25042 Posterior subcapsular polar age-related cataract, left eye: Secondary | ICD-10-CM | POA: Diagnosis not present

## 2024-04-22 DIAGNOSIS — H25012 Cortical age-related cataract, left eye: Secondary | ICD-10-CM | POA: Diagnosis not present

## 2024-04-28 DIAGNOSIS — M79642 Pain in left hand: Secondary | ICD-10-CM | POA: Diagnosis not present

## 2024-05-05 DIAGNOSIS — H52209 Unspecified astigmatism, unspecified eye: Secondary | ICD-10-CM | POA: Diagnosis not present

## 2024-05-05 DIAGNOSIS — H25042 Posterior subcapsular polar age-related cataract, left eye: Secondary | ICD-10-CM | POA: Diagnosis not present

## 2024-05-05 DIAGNOSIS — H25012 Cortical age-related cataract, left eye: Secondary | ICD-10-CM | POA: Diagnosis not present

## 2024-05-05 DIAGNOSIS — H2512 Age-related nuclear cataract, left eye: Secondary | ICD-10-CM | POA: Diagnosis not present

## 2024-05-12 DIAGNOSIS — G4710 Hypersomnia, unspecified: Secondary | ICD-10-CM | POA: Diagnosis not present

## 2024-05-12 DIAGNOSIS — G4700 Insomnia, unspecified: Secondary | ICD-10-CM | POA: Diagnosis not present

## 2024-05-12 DIAGNOSIS — G2581 Restless legs syndrome: Secondary | ICD-10-CM | POA: Diagnosis not present

## 2024-05-14 DIAGNOSIS — G4719 Other hypersomnia: Secondary | ICD-10-CM | POA: Diagnosis not present

## 2024-05-14 DIAGNOSIS — G2581 Restless legs syndrome: Secondary | ICD-10-CM | POA: Diagnosis not present

## 2024-05-14 DIAGNOSIS — R7301 Impaired fasting glucose: Secondary | ICD-10-CM | POA: Diagnosis not present

## 2024-05-14 DIAGNOSIS — E559 Vitamin D deficiency, unspecified: Secondary | ICD-10-CM | POA: Diagnosis not present

## 2024-05-14 DIAGNOSIS — E669 Obesity, unspecified: Secondary | ICD-10-CM | POA: Diagnosis not present

## 2024-05-14 DIAGNOSIS — Z1331 Encounter for screening for depression: Secondary | ICD-10-CM | POA: Diagnosis not present

## 2024-05-14 DIAGNOSIS — Z6831 Body mass index (BMI) 31.0-31.9, adult: Secondary | ICD-10-CM | POA: Diagnosis not present

## 2024-05-14 DIAGNOSIS — E782 Mixed hyperlipidemia: Secondary | ICD-10-CM | POA: Diagnosis not present

## 2024-05-14 DIAGNOSIS — F5081 Binge eating disorder, mild: Secondary | ICD-10-CM | POA: Diagnosis not present

## 2024-05-14 DIAGNOSIS — R931 Abnormal findings on diagnostic imaging of heart and coronary circulation: Secondary | ICD-10-CM | POA: Diagnosis not present

## 2024-05-14 DIAGNOSIS — R0683 Snoring: Secondary | ICD-10-CM | POA: Diagnosis not present

## 2024-05-26 DIAGNOSIS — E782 Mixed hyperlipidemia: Secondary | ICD-10-CM | POA: Diagnosis not present

## 2024-05-26 DIAGNOSIS — G2581 Restless legs syndrome: Secondary | ICD-10-CM | POA: Diagnosis not present

## 2024-05-26 DIAGNOSIS — Z0000 Encounter for general adult medical examination without abnormal findings: Secondary | ICD-10-CM | POA: Diagnosis not present

## 2024-05-28 DIAGNOSIS — E782 Mixed hyperlipidemia: Secondary | ICD-10-CM | POA: Diagnosis not present

## 2024-05-28 DIAGNOSIS — F5081 Binge eating disorder, mild: Secondary | ICD-10-CM | POA: Diagnosis not present

## 2024-05-28 DIAGNOSIS — E669 Obesity, unspecified: Secondary | ICD-10-CM | POA: Diagnosis not present

## 2024-05-28 DIAGNOSIS — Z6831 Body mass index (BMI) 31.0-31.9, adult: Secondary | ICD-10-CM | POA: Diagnosis not present

## 2024-05-28 DIAGNOSIS — R7303 Prediabetes: Secondary | ICD-10-CM | POA: Diagnosis not present

## 2024-05-28 DIAGNOSIS — R931 Abnormal findings on diagnostic imaging of heart and coronary circulation: Secondary | ICD-10-CM | POA: Diagnosis not present

## 2024-06-11 DIAGNOSIS — E669 Obesity, unspecified: Secondary | ICD-10-CM | POA: Diagnosis not present

## 2024-06-11 DIAGNOSIS — Z6831 Body mass index (BMI) 31.0-31.9, adult: Secondary | ICD-10-CM | POA: Diagnosis not present

## 2024-06-11 DIAGNOSIS — F5081 Binge eating disorder, mild: Secondary | ICD-10-CM | POA: Diagnosis not present

## 2024-06-11 DIAGNOSIS — R7303 Prediabetes: Secondary | ICD-10-CM | POA: Diagnosis not present

## 2024-06-11 DIAGNOSIS — E6609 Other obesity due to excess calories: Secondary | ICD-10-CM | POA: Diagnosis not present

## 2024-06-11 DIAGNOSIS — G4700 Insomnia, unspecified: Secondary | ICD-10-CM | POA: Diagnosis not present

## 2024-06-11 DIAGNOSIS — R931 Abnormal findings on diagnostic imaging of heart and coronary circulation: Secondary | ICD-10-CM | POA: Diagnosis not present

## 2024-06-11 DIAGNOSIS — E782 Mixed hyperlipidemia: Secondary | ICD-10-CM | POA: Diagnosis not present

## 2024-06-17 DIAGNOSIS — E538 Deficiency of other specified B group vitamins: Secondary | ICD-10-CM | POA: Diagnosis not present

## 2024-06-17 DIAGNOSIS — E782 Mixed hyperlipidemia: Secondary | ICD-10-CM | POA: Diagnosis not present

## 2024-06-17 DIAGNOSIS — D500 Iron deficiency anemia secondary to blood loss (chronic): Secondary | ICD-10-CM | POA: Diagnosis not present

## 2024-06-17 DIAGNOSIS — Z79899 Other long term (current) drug therapy: Secondary | ICD-10-CM | POA: Diagnosis not present

## 2024-06-17 DIAGNOSIS — R739 Hyperglycemia, unspecified: Secondary | ICD-10-CM | POA: Diagnosis not present

## 2024-06-17 DIAGNOSIS — Z0000 Encounter for general adult medical examination without abnormal findings: Secondary | ICD-10-CM | POA: Diagnosis not present

## 2024-06-20 DIAGNOSIS — Z6831 Body mass index (BMI) 31.0-31.9, adult: Secondary | ICD-10-CM | POA: Diagnosis not present

## 2024-06-20 DIAGNOSIS — Z008 Encounter for other general examination: Secondary | ICD-10-CM | POA: Diagnosis not present

## 2024-06-20 DIAGNOSIS — E669 Obesity, unspecified: Secondary | ICD-10-CM | POA: Diagnosis not present

## 2024-06-20 DIAGNOSIS — E785 Hyperlipidemia, unspecified: Secondary | ICD-10-CM | POA: Diagnosis not present

## 2024-06-26 DIAGNOSIS — R0683 Snoring: Secondary | ICD-10-CM | POA: Diagnosis not present

## 2024-06-26 DIAGNOSIS — F5104 Psychophysiologic insomnia: Secondary | ICD-10-CM | POA: Diagnosis not present

## 2024-06-26 DIAGNOSIS — G2581 Restless legs syndrome: Secondary | ICD-10-CM | POA: Diagnosis not present

## 2024-07-02 DIAGNOSIS — R931 Abnormal findings on diagnostic imaging of heart and coronary circulation: Secondary | ICD-10-CM | POA: Diagnosis not present

## 2024-07-02 DIAGNOSIS — E669 Obesity, unspecified: Secondary | ICD-10-CM | POA: Diagnosis not present

## 2024-07-02 DIAGNOSIS — G4700 Insomnia, unspecified: Secondary | ICD-10-CM | POA: Diagnosis not present

## 2024-07-02 DIAGNOSIS — R7303 Prediabetes: Secondary | ICD-10-CM | POA: Diagnosis not present

## 2024-07-02 DIAGNOSIS — E6609 Other obesity due to excess calories: Secondary | ICD-10-CM | POA: Diagnosis not present

## 2024-07-02 DIAGNOSIS — K219 Gastro-esophageal reflux disease without esophagitis: Secondary | ICD-10-CM | POA: Diagnosis not present

## 2024-07-02 DIAGNOSIS — F5081 Binge eating disorder, mild: Secondary | ICD-10-CM | POA: Diagnosis not present

## 2024-07-02 DIAGNOSIS — Z6830 Body mass index (BMI) 30.0-30.9, adult: Secondary | ICD-10-CM | POA: Diagnosis not present

## 2024-07-02 DIAGNOSIS — E782 Mixed hyperlipidemia: Secondary | ICD-10-CM | POA: Diagnosis not present

## 2024-07-04 ENCOUNTER — Other Ambulatory Visit: Payer: Self-pay | Admitting: Internal Medicine

## 2024-07-04 DIAGNOSIS — Z1231 Encounter for screening mammogram for malignant neoplasm of breast: Secondary | ICD-10-CM

## 2024-07-09 DIAGNOSIS — G4733 Obstructive sleep apnea (adult) (pediatric): Secondary | ICD-10-CM | POA: Diagnosis not present

## 2024-07-14 ENCOUNTER — Other Ambulatory Visit (HOSPITAL_BASED_OUTPATIENT_CLINIC_OR_DEPARTMENT_OTHER): Payer: Self-pay

## 2024-07-14 ENCOUNTER — Other Ambulatory Visit: Payer: Self-pay

## 2024-07-14 MED ORDER — SEMAGLUTIDE-WEIGHT MANAGEMENT 0.25 MG/0.5ML ~~LOC~~ SOAJ
0.2500 mg | SUBCUTANEOUS | 1 refills | Status: DC
  Filled 2024-07-14: qty 2, 28d supply, fill #0

## 2024-07-15 DIAGNOSIS — G4733 Obstructive sleep apnea (adult) (pediatric): Secondary | ICD-10-CM | POA: Diagnosis not present

## 2024-07-15 DIAGNOSIS — G4734 Idiopathic sleep related nonobstructive alveolar hypoventilation: Secondary | ICD-10-CM | POA: Diagnosis not present

## 2024-07-15 DIAGNOSIS — G4700 Insomnia, unspecified: Secondary | ICD-10-CM | POA: Diagnosis not present

## 2024-10-02 ENCOUNTER — Other Ambulatory Visit: Payer: Self-pay | Admitting: Medical Genetics

## 2024-10-07 ENCOUNTER — Other Ambulatory Visit (HOSPITAL_COMMUNITY)
Admission: RE | Admit: 2024-10-07 | Discharge: 2024-10-07 | Payer: Self-pay | Attending: Medical Genetics | Admitting: Medical Genetics

## 2024-10-09 ENCOUNTER — Encounter

## 2024-10-19 LAB — GENECONNECT MOLECULAR SCREEN: Genetic Analysis Overall Interpretation: NEGATIVE

## 2024-10-20 NOTE — H&P (View-Only) (Signed)
 "  ORTHOPAEDIC SURGERY- CLINIC NOTE  Chief Complaint: Left wrist cyst  History of Present Illness: History of Present Illness Michele Robinson is a 68 year old female with left wrist cyst who presents with progressive left wrist pain and enlarging mass.  A mass developed on the left wrist in the summer of 2025, initially asymptomatic. Over time, the mass has progressively enlarged and is now associated with pain, particularly with activity. As she is left-hand dominant, the pain is more pronounced with use. She describes intermittent shooting pain radiating toward the knuckle and occasional discomfort suggestive of possible nerve involvement.  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 There is no history of prior interventions for the wrist mass. She previously discussed the mass with another provider, who suspected a ganglion cyst and advised against aspiration due to high recurrence rates. She has not attempted any treatments for the cyst prior to this visit.  Prior medical records from her PCP were reviewed.    PMHx, PSurgHx, Fam Hx, Soc Hx, Meds, Allergies: Past Medical History:  Diagnosis Date   B12 deficiency 09/11/2017   260, 11/18   Bilateral plantar fasciitis    Hyperlipidemia 2014   Overactive bladder    Prolapse of female bladder, acquired    Restless leg    Shingles    right eye   Stress incontinence     Past Surgical History:  Procedure Laterality Date   COLONOSCOPY  08/30/2009   Dr. FABIENE Holmes @ [facility] - Int. Hemorrh., Diverticulosis, FHPolyps(m), rpt 5 yrs per RTE   BREAST BIOPSY  2016   COLONOSCOPY  06/23/2019   Intenal hemorriods/Otherwise normal colon/FHx CP-Mother/Repeat 51yrs/[place]   SLING FOR STRESS INCONTINENCE N/A 08/30/2020   Procedure: SLING OPERATION FOR STRESS INCONTINENCE;  Surgeon: Erik Curtistine Roers, MD;  Location: [facility];  Service: Gynecology;  Laterality: N/A;   CYSTOURETHROSCOPY N/A  08/30/2020   Procedure: CYSTOURETHROSCOPY;  Surgeon: Erik Curtistine Roers, MD;  Location: [facility];  Service: Gynecology;  Laterality: N/A;   COLPORRHAPHY FOR REPAIR CYSTOCELE ANTERIOR N/A 08/30/2020   Procedure: ANTERIOR COLPORRHAPHY, REPAIR OF CYSTOCELE WITH OR WITHOUT REPAIR OF URETHROCELE, INCLUDING CYSTOURETHROSCOPY, WHEN PERFORMED;  Surgeon: Erik Curtistine Roers, MD;  Location: [facility];  Service: Gynecology;  Laterality: N/A;   foot surgery  08/2022   CESAREAN SECTION      Family History  Problem Relation Age of Onset   Leukemia Mother    Colon polyps Mother 58   Cancer Mother    Heart disease Father    High blood pressure (Hypertension) Father    Stroke Father    Parkinsonism Brother    Anesthesia problems Neg Hx     Social History   Socioeconomic History   Marital status: Married  Tobacco Use   Smoking status: Never    Passive exposure: Never   Smokeless tobacco: Never  Vaping Use   Vaping status: Never Used  Substance and Sexual Activity   Alcohol use: No    Alcohol/week: 0.0 standard drinks of alcohol   Drug use: No   Sexual activity: Yes    Partners: Male    Birth control/protection: Post-menopausal  Other Topics Concern   Would you please tell us  about the people who live in your home, your pets, or anything else important to your social life? Yes    Comment: Husband  Social History Narrative   Retired runner, broadcasting/film/video.   Social Drivers of Corporate Investment Banker  Strain: Low Risk  (10/20/2024)   Overall Financial Resource Strain (CARDIA)    Difficulty of Paying Living Expenses: Not hard at all  Food Insecurity: No Food Insecurity (10/20/2024)   Hunger Vital Sign    Worried About Running Out of Food in the Last Year: Never true    Ran Out of Food in the Last Year: Never true  Transportation Needs: No Transportation Needs (10/20/2024)   PRAPARE - Administrator, Civil Service (Medical): No    Lack of Transportation  (Non-Medical): No  Housing Stability: Low Risk  (10/20/2024)   Housing Stability Vital Sign    Unable to Pay for Housing in the Last Year: No    Number of Times Moved in the Last Year: 0    Homeless in the Last Year: No     Current Outpatient Medications  Medication Sig Dispense Refill   calcium carbonate-vitamin D3 500 mg-10 mcg (400 unit) Chew Take by mouth daily       cyanocobalamin  (VITAMIN B12) 1000 MCG tablet Take 1,000 mcg by mouth once daily     OPZELURA 1.5 % Crea 1(ONE) APPLICATION(S) TOPICAL 2(TWO) TIMES A DAY     pantoprazole (PROTONIX) 40 MG DR tablet Take 1 tablet (40 mg total) by mouth once daily 90 tablet 3   pramipexole (MIRAPEX) 0.25 MG tablet Take 1 tablet (0.25 mg total) by mouth 2 (two) times daily 180 tablet 3   rosuvastatin (CRESTOR) 10 MG tablet Take 1 tablet (10 mg total) by mouth once daily 90 tablet 3   sertraline (ZOLOFT) 50 MG tablet Take 1 tablet (50 mg total) by mouth once daily 90 tablet 3   temazepam (RESTORIL) 15 mg capsule Take 1 capsule (15 mg total) by mouth at bedtime for 180 days 90 capsule 1   tirzepatide (ZEPBOUND) 5 mg/0.5 mL injection Inject 0.5 mLs (5 mg total) subcutaneously every 7 (seven) days 2 mL 8   No current facility-administered medications for this visit.    Allergies  Allergen Reactions   Milk Other (See Comments)    Stomach upset, intolerant   Penicillin Other (See Comments)    Review of Systems: A 10+ ROS was performed, reviewed, and the pertinent orthopaedic findings are documented in the HPI.  I have reviewed and agree with the ROS captured by the CMA.    Physical Exam: BP 110/64   Ht 162.6 cm (5' 4)   Wt 77.8 kg (171 lb 9.6 oz)   BMI 29.46 kg/m  General/Constitutional: No apparent distress: well-nourished and well developed. Eyes: Pupils equal, round with synchronous movement. Lymphatic: No palpable adenopathy. Respiratory: Patient has good chest rise and fall with inspiration and expiration.  All  lung fields are clear to auscultation bilaterally.  There is no Rales, rhonchi or wheezes appreciated. Cardiovascular: Upon auscultation there is a regular rate and rhythm without any murmurs, rubs, gallops or heaves appreciated. Integumentary: No impressive skin lesions present, except as noted in detailed exam. Neuro/Psych: Normal mood and affect, oriented to person, place and time. Musculoskeletal: see exam below  Left Upper Extremity: Dorsal central cyst at the wrist measuring about 1.5 x 1.5 cm.  The cyst is firm but compressible, mobile.  She has full pronosupination of the forearm and full wrist flexion extension.  Sensation is intact to light touch throughout.  Digits are warm well-perfused.  She is able to make a full composite fist and extend all of her digits. Imaging and Results: Radiographs of the left wrist were obtained  in clinic today and personally interpreted.  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  Otherwise, no areas of significant degeneration or erosive changes.  She appears to be about 2 to 3 mm ulnar positive on these radiographs.  Assessment & Plan: Assessment & Plan Ganglion cyst of left wrist Chronic, symptomatic ganglion cyst with increased size and pain, likely from joint lining, possibly linked to osteoarthritis. Persistent symptoms and functional impairment warrant surgical excision. Aspiration less effective due to high recurrence. Surgical risks include infection, injury to tendons, nerves, or veins, and recurrence rate.  - Risk, benefits, alternatives to surgical excision of the cyst were discussed with the patient and she would like to undergo operative intervention -Discussed risks including but not limited to infection, injury to nearby structures, stiffness, recurrence, pain, need for revision surgery - Explained surgical procedure,  anesthesia plan (regional block with mild sedation), and expected outpatient course. -Patient will follow-up postoperatively  Jackquline CANDIE Barrack, MD [facility] [address] [place], [place] [address] Phone: [phone]  This note was generated in part with voice recognition software; please excuse any typographical errors that were not detected and corrected. This note has been created using automated tools and reviewed for accuracy by [facility].  "

## 2024-10-27 ENCOUNTER — Other Ambulatory Visit: Payer: Self-pay

## 2024-10-28 ENCOUNTER — Other Ambulatory Visit (HOSPITAL_BASED_OUTPATIENT_CLINIC_OR_DEPARTMENT_OTHER): Payer: Self-pay

## 2024-10-28 MED ORDER — ADACEL 5-2-15.5 LF-MCG/0.5 IM SUSY
0.5000 mL | PREFILLED_SYRINGE | Freq: Once | INTRAMUSCULAR | 0 refills | Status: AC
  Filled 2024-10-28: qty 0.5, 1d supply, fill #0

## 2024-11-03 ENCOUNTER — Encounter
Admission: RE | Admit: 2024-11-03 | Discharge: 2024-11-03 | Disposition: A | Discharge status: Home / Self Care | Source: Ambulatory Visit

## 2024-11-03 ENCOUNTER — Other Ambulatory Visit: Payer: Self-pay

## 2024-11-03 VITALS — Ht 64.0 in | Wt 171.0 lb

## 2024-11-03 DIAGNOSIS — Z01810 Encounter for preprocedural cardiovascular examination: Secondary | ICD-10-CM

## 2024-11-03 HISTORY — DX: Stress incontinence (female) (male): N39.3

## 2024-11-03 HISTORY — DX: Cystocele, unspecified: N81.10

## 2024-11-03 NOTE — Patient Instructions (Addendum)
 Your procedure is scheduled nw:ULZDIJB JANUARY 13  Report to the Registration Desk on the 1st floor of the [facility] To find out your arrival time, please call [phone] between 1PM - 3PM on:    MONDAY   JANUARY 12 If your arrival time is 6:00 am, do not arrive before that time as the [facility] entrance doors do not open until 6:00 am.  REMEMBER: Instructions that are not followed completely may result in serious medical risk, up to and including death; or upon the discretion of your surgeon and anesthesiologist your surgery may need to be rescheduled.  Do not eat food after midnight the night before surgery.  No gum chewing or hard candies.  You may however, drink CLEAR liquids up to 3 hours before you are scheduled to arrive for your surgery. Do not drink anything within 3 hours of your scheduled arrival time.  Clear liquids include: - water  - apple juice without pulp - gatorade (not RED colors) - black coffee or tea (Do NOT add milk or creamers to the coffee or tea) Do NOT drink anything that is not on this list.  In addition, your doctor has ordered for you to drink the provided:  Ensure Pre-Surgery Clear Carbohydrate Drink   Drinking this carbohydrate drink up to three hours before surgery helps to reduce insulin  resistance and improve patient outcomes. Please complete drinking 3 hours before scheduled arrival time.  One week prior to surgery: Stop Anti-inflammatories (NSAIDS) such as Advil, Aleve, Ibuprofen, Motrin, Naproxen, Naprosyn and Aspirin based products such as Excedrin, Goody's Powder, BC Powder. Stop ANY OVER THE COUNTER supplements until after surgery. Biotin  Calcium Carb-Cholecalciferol  cyanocobalamin  (VITAMIN B12)  Omega-3 Fatty Acids   You may however, continue to take Tylenol  if needed for pain up until the day of surgery.  **Follow guidelines for insulin  and diabetes medications.** tirzepatide (ZEPBOUND) hold 7 days prior to surgery, last dose  Monday January 5   Continue taking all of your other prescription medications up until the day of surgery.  ON THE DAY OF SURGERY DO NOT TAKE ANY MEDICATIONS  No Alcohol for 24 hours before or after surgery.  Do not use any recreational drugs for at least a week (preferably 2 weeks) before your surgery.  Please be advised that the combination of cocaine and anesthesia may have negative outcomes, up to and including death. If you test positive for cocaine, your surgery will be cancelled.  On the morning of surgery brush your teeth with toothpaste and water, you may rinse your mouth with mouthwash if you wish. Do not swallow any toothpaste or mouthwash.  Use antibacterial soap ( e.g. Dial) to bath the night before surgery  Do not wear jewelry, make-up, hairpins, clips or nail polish.  For welded (permanent) jewelry: bracelets, anklets, waist bands, etc.  Please have this removed prior to surgery.  If it is not removed, there is a chance that hospital personnel will need to cut it off on the day of surgery.  Do not wear lotions, powders, or perfumes.   Do not shave body hair from the neck down 48 hours before surgery.  Do not bring valuables to the hospital. [facility] is not responsible for any missing/lost belongings or valuables.   Notify your doctor if there is any change in your medical condition (cold, fever, infection).  Wear comfortable clothing (specific to your surgery type) to the hospital.  After surgery, you can help prevent lung complications by doing  breathing exercises.  Take deep breaths and cough every 1-2 hours. Your doctor may order a device called an Incentive Spirometer to help you take deep breaths.  If you are being discharged the day of surgery, you will not be allowed to drive home. You will need a responsible individual to drive you home and stay with you for 24 hours after surgery.   If you are taking public transportation, you will need to have a  responsible individual with you.  Please call the Pre-admissions Testing Dept. at [phone] if you have any questions about these instructions.  Surgery Visitation Policy:  Patients having surgery or a procedure may have two visitors.  Children under the age of 50 must have an adult with them who is not the patient.  Merchandiser, Retail to address health-related social needs:  [email or web address]

## 2024-11-07 ENCOUNTER — Ambulatory Visit
Admission: RE | Admit: 2024-11-07 | Discharge: 2024-11-07 | Disposition: A | Payer: Self-pay | Source: Ambulatory Visit | Attending: Internal Medicine | Admitting: Internal Medicine

## 2024-11-07 DIAGNOSIS — Z1231 Encounter for screening mammogram for malignant neoplasm of breast: Secondary | ICD-10-CM | POA: Insufficient documentation

## 2024-11-11 ENCOUNTER — Encounter: Admission: RE | Disposition: A | Discharge status: Home / Self Care | Payer: Self-pay | Source: Home / Self Care

## 2024-11-11 ENCOUNTER — Ambulatory Visit: Admission: RE | Admit: 2024-11-11 | Discharge: 2024-11-11 | Disposition: A | Discharge status: Home / Self Care

## 2024-11-11 ENCOUNTER — Ambulatory Visit: Admitting: Anesthesiology

## 2024-11-11 ENCOUNTER — Other Ambulatory Visit: Payer: Self-pay

## 2024-11-11 ENCOUNTER — Ambulatory Visit

## 2024-11-11 DIAGNOSIS — M25532 Pain in left wrist: Secondary | ICD-10-CM | POA: Diagnosis present

## 2024-11-11 DIAGNOSIS — M6740 Ganglion, unspecified site: Secondary | ICD-10-CM

## 2024-11-11 DIAGNOSIS — M67432 Ganglion, left wrist: Secondary | ICD-10-CM | POA: Insufficient documentation

## 2024-11-11 DIAGNOSIS — Z01810 Encounter for preprocedural cardiovascular examination: Secondary | ICD-10-CM

## 2024-11-11 DIAGNOSIS — M1990 Unspecified osteoarthritis, unspecified site: Secondary | ICD-10-CM | POA: Diagnosis not present

## 2024-11-11 HISTORY — PX: GANGLION CYST EXCISION: SHX1691

## 2024-11-11 SURGERY — EXCISION, GANGLION CYST, WRIST
Anesthesia: General | Site: Wrist | Laterality: Left

## 2024-11-11 MED ORDER — MIDAZOLAM HCL 2 MG/2ML IJ SOLN
INTRAMUSCULAR | Status: AC
  Filled 2024-11-11: qty 2

## 2024-11-11 MED ORDER — LACTATED RINGERS IV SOLN: INTRAVENOUS | Status: DC

## 2024-11-11 MED ORDER — BUPIVACAINE HCL (PF) 0.5 % IJ SOLN
INTRAMUSCULAR | Status: AC
  Filled 2024-11-11: qty 20

## 2024-11-11 MED ORDER — CEFAZOLIN SODIUM-DEXTROSE 2-4 GM/100ML-% IV SOLN
INTRAVENOUS | Status: AC
  Filled 2024-11-11: qty 100

## 2024-11-11 MED ORDER — PHENYLEPHRINE 80 MCG/ML (10ML) SYRINGE FOR IV PUSH (FOR BLOOD PRESSURE SUPPORT)
PREFILLED_SYRINGE | INTRAVENOUS | Status: AC
  Filled 2024-11-11: qty 10

## 2024-11-11 MED ORDER — FENTANYL CITRATE (PF) 50 MCG/ML IJ SOSY
PREFILLED_SYRINGE | INTRAMUSCULAR | Status: AC
  Filled 2024-11-11: qty 1

## 2024-11-11 MED ORDER — PROPOFOL 500 MG/50ML IV EMUL
INTRAVENOUS | Status: DC | PRN
  Administered 2024-11-11: 40 mg via INTRAVENOUS
  Administered 2024-11-11: 125 ug/kg/min via INTRAVENOUS

## 2024-11-11 MED ORDER — CHLORHEXIDINE GLUCONATE 0.12 % MT SOLN
OROMUCOSAL | Status: AC
  Filled 2024-11-11: qty 15

## 2024-11-11 MED ORDER — CEFAZOLIN SODIUM-DEXTROSE 2-4 GM/100ML-% IV SOLN
2.0000 g | INTRAVENOUS | Status: AC
  Administered 2024-11-11: 2 g via INTRAVENOUS

## 2024-11-11 MED ORDER — KETOROLAC TROMETHAMINE 30 MG/ML IJ SOLN
INTRAMUSCULAR | Status: AC
  Filled 2024-11-11: qty 1

## 2024-11-11 MED ORDER — OXYCODONE HCL 5 MG PO TABS: 5.0000 mg | ORAL_TABLET | Freq: Three times a day (TID) | ORAL | 0 refills | Status: AC | PRN

## 2024-11-11 MED ORDER — CHLORHEXIDINE GLUCONATE 0.12 % MT SOLN
15.0000 mL | Freq: Once | OROMUCOSAL | Status: AC
  Administered 2024-11-11: 15 mL via OROMUCOSAL

## 2024-11-11 MED ORDER — PHENYLEPHRINE 80 MCG/ML (10ML) SYRINGE FOR IV PUSH (FOR BLOOD PRESSURE SUPPORT)
PREFILLED_SYRINGE | INTRAVENOUS | Status: DC | PRN
  Administered 2024-11-11 (×2): 80 ug via INTRAVENOUS

## 2024-11-11 MED ORDER — EPHEDRINE 5 MG/ML INJ
INTRAVENOUS | Status: AC
  Filled 2024-11-11: qty 5

## 2024-11-11 MED ORDER — MIDAZOLAM HCL (PF) 2 MG/2ML IJ SOLN
INTRAMUSCULAR | Status: DC | PRN
  Administered 2024-11-11: 2 mg via INTRAVENOUS

## 2024-11-11 MED ORDER — EPHEDRINE SULFATE (PRESSORS) 25 MG/5ML IV SOSY
PREFILLED_SYRINGE | INTRAVENOUS | Status: DC | PRN
  Administered 2024-11-11 (×2): 10 mg via INTRAVENOUS

## 2024-11-11 MED ORDER — ORAL CARE MOUTH RINSE: 15.0000 mL | Freq: Once | OROMUCOSAL | Status: AC

## 2024-11-11 MED ORDER — FENTANYL CITRATE (PF) 100 MCG/2ML IJ SOLN
INTRAMUSCULAR | Status: DC | PRN
  Administered 2024-11-11: 50 ug via INTRAVENOUS

## 2024-11-11 SURGICAL SUPPLY — 26 items
BLADE MINI RND TIP GREEN BEAV (BLADE) ×1 IMPLANT
BNDG COHESIVE 4X5 TAN STRL LF (GAUZE/BANDAGES/DRESSINGS) IMPLANT
BNDG ELASTIC 2INX 5YD STR LF (GAUZE/BANDAGES/DRESSINGS) ×1 IMPLANT
BNDG ELASTIC 4INX 5YD STR LF (GAUZE/BANDAGES/DRESSINGS) ×1 IMPLANT
BNDG ESMARCH 4X12 STRL LF (GAUZE/BANDAGES/DRESSINGS) ×1 IMPLANT
CHLORAPREP W/TINT 26 (MISCELLANEOUS) ×1 IMPLANT
CORD BIP STRL DISP 12FT (MISCELLANEOUS) ×1 IMPLANT
CUFF TOURN SGL QUICK 18X4 (TOURNIQUET CUFF) IMPLANT
FORCEPS JEWEL BIP 4-3/4 STR (INSTRUMENTS) ×1 IMPLANT
GAUZE SPONGE 4X4 12PLY STRL (GAUZE/BANDAGES/DRESSINGS) ×1 IMPLANT
GAUZE XEROFORM 1X8 LF (GAUZE/BANDAGES/DRESSINGS) ×1 IMPLANT
GLOVE BIO SURGEON STRL SZ7.5 (GLOVE) ×1 IMPLANT
GLOVE BIOGEL PI IND STRL 8 (GLOVE) ×1 IMPLANT
GOWN SRG XL LVL 3 NONREINFORCE (GOWNS) ×1 IMPLANT
KIT TURNOVER KIT A (KITS) ×1 IMPLANT
MANIFOLD NEPTUNE II (INSTRUMENTS) ×1 IMPLANT
NS IRRIG 500ML POUR BTL (IV SOLUTION) ×1 IMPLANT
PACK EXTREMITY ARMC (MISCELLANEOUS) ×1 IMPLANT
PAD ARMBOARD POSITIONER FOAM (MISCELLANEOUS) ×1 IMPLANT
PAD CAST CTTN 4X4 STRL (SOFTGOODS) IMPLANT
PADDING CAST BLEND 4X4 STRL (MISCELLANEOUS) ×1 IMPLANT
PENCIL SMOKE EVACUATOR (MISCELLANEOUS) IMPLANT
SOLN STERILE WATER 500 ML (IV SOLUTION) IMPLANT
STOCKINETTE IMPERVIOUS 9X36 MD (GAUZE/BANDAGES/DRESSINGS) IMPLANT
SUTURE ETHLN 4-0 PC3 18XMF (SUTURE) ×1 IMPLANT
TRAP FLUID SMOKE EVACUATOR (MISCELLANEOUS) ×1 IMPLANT

## 2024-11-11 NOTE — Interval H&P Note (Signed)
 History and Physical Interval Note:  11/11/2024 8:51 AM  Michele Robinson  has presented today for surgery, with the diagnosis of Ganglion of left wrist M67.432 Left wrist pain M25.532.  The various methods of treatment have been discussed with the patient and family. After consideration of risks, benefits and other options for treatment, the patient has consented to  Procedures: EXCISION, GANGLION CYST, WRIST (Left) as a surgical intervention.  The patient's history has been reviewed, patient examined, no change in status, stable for surgery.  I have reviewed the patient's chart and labs.  Questions were answered to the patient's satisfaction.     Michele Robinson

## 2024-11-11 NOTE — Anesthesia Procedure Notes (Signed)
 Anesthesia Regional Block: Supraclavicular block   Pre-Anesthetic Checklist: , timeout performed,  Correct Patient, Correct Site, Correct Laterality,  Correct Procedure, Correct Position, site marked,  Risks and benefits discussed,  Surgical consent,  Pre-op evaluation,  At surgeon's request and post-op pain management  Laterality: Upper and Left  Prep: chloraprep       Needles:  Injection technique: Single-shot  Needle Type: Stimiplex     Needle Length: 9cm  Needle Gauge: 22     Additional Needles:   Procedures:,,,, ultrasound used (permanent image in chart),,    Narrative:  Start time: 11/11/2024 8:45 AM End time: 11/11/2024 9:50 AM Injection made incrementally with aspirations every 5 mL.  Performed by: Personally  Anesthesiologist: Vicci Camellia Glatter, MD  Additional Notes: Patient consented for risk and benefits of nerve block including but not limited to nerve damage, failed block, bleeding and infection.  Patient voiced understanding.  Functioning IV was confirmed and monitors were applied.  Timeout done prior to procedure and prior to any sedation being given to the patient.  Patient confirmed procedure site prior to any sedation given to the patient. Sterile prep,hand hygiene and sterile gloves were used.  Minimal sedation used for procedure.  No paresthesia endorsed by patient during the procedure.  Negative aspiration and negative test dose prior to incremental administration of local anesthetic. The patient tolerated the procedure well with no immediate complications.

## 2024-11-11 NOTE — Transfer of Care (Signed)
 Immediate Anesthesia Transfer of Care Note  Patient: Michele Robinson  Procedure(s) Performed: EXCISION, GANGLION CYST, WRIST (Left: Wrist)  Patient Location: PACU  Anesthesia Type:General  Level of Consciousness: awake  Airway & Oxygen Therapy: Patient Spontanous Breathing  Post-op Assessment: Report given to RN and Post -op Vital signs reviewed and stable  Post vital signs: Reviewed and stable  Last Vitals:  Vitals Value Taken Time  BP 111/48 11/11/24 10:03  Temp    Pulse 70 11/11/24 10:07  Resp 18 11/11/24 10:07  SpO2 96 % 11/11/24 10:07  Vitals shown include unfiled device data.  Last Pain:  Vitals:   11/11/24 0816  TempSrc: Temporal  PainSc: 0-No pain         Complications: There were no known notable events for this encounter.

## 2024-11-11 NOTE — Op Note (Signed)
 Date of procedure:  11/11/2024  Surgeon:  Jackquline CANDIE Barrack, MD   Procedure(s) performed:   Procedures: EXCISION, GANGLION CYST, WRIST (Left) (CPT [phone])  Assistant(s): none   Preoperative diagnosis:   Ganglion of left wrist M67.432   Postoperative diagnosis:   Ganglion of left wrist M67.432   Procedure findings:  as above  Anesthesia:  Regional + MAC   Estimated blood loss:  Minimal   Specimen:  Dorsal wrist cyst   Complications:  None apparent   Implants: None   Indications: 69 y.o. year old female with left dorsal wrist cyst .  We discussed operative treatment and explained the risks, benefits, and alternatives as well as the expected postoperative course.  The patient elected to proceed with surgery.   Procedure in detail:  The patient was met in the pre-op holding area and the left upper extremity was marked and the consent confirmed.  Anesthesia placed a short acting regional block.  The patient was then taken to the operating room and the hand table was attached to the stretcher.  Antibiotics were administered prior to incision.  All bony prominences were well padded.  A tourniquet was placed high on the operative extremity. The operative extremity was then prepped and draped in the usual sterile fashion.  A timeout was performed confirming the correct patient, site, and procedure.   An Esmarch was used to exsanguinate the extremity and the tourniquet was insufflated to 250 mmHg.  A transverse incision over the dorsal wrist cyst was made.  Dissection was performed around the cyst removing the adherent soft tissue while making sure to protect surrounding sensory nerves and tendons.  The stalk of the cyst was identified going deep into the wrist joint.  This was dissected down to the joint and transected at this point.  The area at the base of the stalk and dorsal wrist capsule was fulgurated with bipolar cautery.  The tourniquet was then let down.   The wound was irrigated  with normal saline.  Adequate hemostasis was obtained.  The incision was closed with 4-0 Nylon.  Sterile dressings were applied.  The patient was transferred to the PACU in stable condition.  Counts were correct x2.

## 2024-11-11 NOTE — Anesthesia Preprocedure Evaluation (Signed)
"                                    Anesthesia Evaluation  Patient identified by MRN, date of birth, ID band Patient awake    Reviewed: Allergy & Precautions, H&P , NPO status , Patient's Chart, lab work & pertinent test results  Airway Mallampati: II  TM Distance: >3 FB Neck ROM: full    Dental no notable dental hx.    Pulmonary neg pulmonary ROS   Pulmonary exam normal        Cardiovascular negative cardio ROS Normal cardiovascular exam     Neuro/Psych negative neurological ROS  negative psych ROS   GI/Hepatic negative GI ROS, Neg liver ROS,,,  Endo/Other  negative endocrine ROS    Renal/GU negative Renal ROS  negative genitourinary   Musculoskeletal   Abdominal   Peds  Hematology negative hematology ROS (+)   Anesthesia Other Findings Past Medical History: No date: Arthritis     Comment:  hips and thunbs No date: Hypercholesteremia No date: OAB (overactive bladder) No date: Plantar fasciitis, bilateral No date: Prolapse of female bladder, acquired No date: RLS (restless legs syndrome) No date: Shingles     Comment:  right eye No date: Stress incontinence No date: Wears contact lenses  Past Surgical History: 09/28/2016: BREAST BIOPSY; Left     Comment:  benign 08/30/2022: BUNIONECTOMY; Left     Comment:  Procedure: LAPIDUS - TYPE;  Surgeon: Ashley Soulier,               DPM;  Location: [facility];  Service: Podiatry;               Laterality: Left; No date: CESAREAN SECTION No date: COLONOSCOPY 2021: COLPORRHAPHY FOR REPAIR CYSTOCELE ANTERIOR 2021: CYSTOURETHROSCOPY     Comment:  with colporrhaphy (anterior cystocele repair)  and               bladder sling 08/30/2022: EXCISION MORTON'S NEUROMA; Left     Comment:  Procedure: EXCISION MORTON'S NEUROMA;  Surgeon: Ashley Soulier, DPM;  Location: [facility];  Service:               Podiatry;  Laterality: Left; 2021: SLING FOR STRESS INCONTINENCE      Reproductive/Obstetrics negative OB ROS                              Anesthesia Physical Anesthesia Plan  ASA: 2  Anesthesia Plan: General   Post-op Pain Management:    Induction:   PONV Risk Score and Plan: Propofol  infusion and TIVA  Airway Management Planned:   Additional Equipment:   Intra-op Plan:   Post-operative Plan:   Informed Consent:      Dental Advisory Given  Plan Discussed with: CRNA and Surgeon  Anesthesia Plan Comments:          Anesthesia Quick Evaluation  "

## 2024-11-11 NOTE — Discharge Instructions (Signed)
 Discharge Instructions After Surgery   Diet   Return to your regular diet as tolerated.   Dressings   You may remove your dressings after 3-4 days.  Okay to shower at this point but do not soak in water (baths, swimming, etc).  It is okay to have a small spot of blood on your dressing as long as it does not keep getting bigger.   In the first 3-4 days, cover the dressing and keep it dry in the shower or bath. Keep your arm up and out of the water.   Do not put any ointments, creams, alcohol , or hydrogen peroxide on your incisions.  Activity   If your fingers are free, work on making a full fist and straightening out the fingers within the limits of your dressing.  Move other joints such as your elbow or shoulder multiple times a day to prevent getting stiff.  No lifting, carrying, pushing, or pulling with your arm until your follow-up visit. You may use your hand or fingers for simple things like writing, typing, eating, and brushing your teeth.   If you were given a sling, wear it while your hand or arm is numb. You can stop wearing the sling when you have feeling and strength back in your hand or arm. You may also use the sling as needed to elevate, protect, or support your arm until your follow-up visit.   Pain   The first 48 hours after surgery can hurt the most. You should use the different types of pain medicine along with rest and elevation to help keep the pain manageable.   NSAIDS such as ibuprofen (Advil or Motrin) or naproxen  (Aleve ) can help with pain and swelling.   Acetaminophen  (Tylenol ) can be used with NSAIDs. Do not take more than 3000 mg of Tylenol  in a 24-hour period.   Narcotic pain medicine such as hydrocodone  or oxycodone , should be used as prescribed. Stop taking as soon as possible. Take with food to help prevent nausea. Do not drink alcohol  or drive while taking narcotics. Be aware that hydrocodone  tablets often already contain acetaminophen  (Tylenol ).   Elevating your operative extremity above the level of your heart will help with pain and swelling. You can rest your arm on several pillows for support while sleeping or resting.   Constipation   Narcotic pain medicines, changes in eating and drinking, and less activity may cause constipation after surgery.   Over-the-counter stool softeners like docusate (Colace), Senna, or Miralax can help. Follow the directions on the bottle.   Stay hydrated and try to eat high fiber foods.   When to call your surgeon   If your dressing gets wet or very dirty.   Fever more than 101 F.   Incision that is very red, swollen, draining pus, or feels hot.   Severe pain, not getting better with pain medicine.   Severe swelling, even with elevation.   If your fingers become cool, cold, or dark in color.   Bleeding that is getting bigger or soaking through the dressing.   Severe nausea and vomiting.   Problems using the bathroom or no bowel movement for 2-3 days.   For questions or concerns, please call [phone].  Jackquline CANDIE Barrack, MD Orthopaedic Surgery [facility]

## 2024-11-12 LAB — SURGICAL PATHOLOGY

## 2024-11-12 NOTE — Anesthesia Postprocedure Evaluation (Signed)
"   Anesthesia Post Note  Patient: Emelly Wurtz Whiters  Procedure(s) Performed: EXCISION, GANGLION CYST, WRIST (Left: Wrist)  Patient location during evaluation: PACU Anesthesia Type: General Level of consciousness: awake and alert Pain management: pain level controlled Vital Signs Assessment: post-procedure vital signs reviewed and stable Respiratory status: spontaneous breathing, nonlabored ventilation and respiratory function stable Cardiovascular status: blood pressure returned to baseline, stable and bradycardic Postop Assessment: no apparent nausea or vomiting Anesthetic complications: no   There were no known notable events for this encounter.   Last Vitals:  Vitals:   11/11/24 1030 11/11/24 1044  BP: 138/66 130/63  Pulse: (!) 50 (!) 56  Resp: 11   Temp: 36.6 C (!) 36.1 C  SpO2: 100% 100%    Last Pain:  Vitals:   11/12/24 0907  TempSrc:   PainSc: 0-No pain                 Camellia Merilee Louder      "
